# Patient Record
Sex: Male | Born: 1960 | Race: White | Hispanic: No | Marital: Single | Smoking: Current every day smoker
Health system: Southern US, Community
[De-identification: ages and names within clinical notes are randomized; demographics above are authoritative.]

## PROBLEM LIST (undated history)

## (undated) DIAGNOSIS — M1990 Unspecified osteoarthritis, unspecified site: Secondary | ICD-10-CM

## (undated) DIAGNOSIS — F32A Depression, unspecified: Secondary | ICD-10-CM

## (undated) DIAGNOSIS — I2510 Atherosclerotic heart disease of native coronary artery without angina pectoris: Secondary | ICD-10-CM

## (undated) DIAGNOSIS — Z955 Presence of coronary angioplasty implant and graft: Secondary | ICD-10-CM

## (undated) DIAGNOSIS — F329 Major depressive disorder, single episode, unspecified: Secondary | ICD-10-CM

## (undated) DIAGNOSIS — F17210 Nicotine dependence, cigarettes, uncomplicated: Secondary | ICD-10-CM

## (undated) DIAGNOSIS — E785 Hyperlipidemia, unspecified: Secondary | ICD-10-CM

## (undated) DIAGNOSIS — I219 Acute myocardial infarction, unspecified: Secondary | ICD-10-CM

## (undated) HISTORY — DX: Nicotine dependence, cigarettes, uncomplicated: F17.210

## (undated) HISTORY — DX: Presence of coronary angioplasty implant and graft: Z95.5

## (undated) HISTORY — DX: Unspecified osteoarthritis, unspecified site: M19.90

## (undated) HISTORY — DX: Atherosclerotic heart disease of native coronary artery without angina pectoris: I25.10

## (undated) HISTORY — DX: Hyperlipidemia, unspecified: E78.5

---

## 2017-12-15 ENCOUNTER — Encounter (HOSPITAL_COMMUNITY): Payer: Self-pay | Admitting: Emergency Medicine

## 2017-12-15 ENCOUNTER — Emergency Department (HOSPITAL_COMMUNITY): Payer: BLUE CROSS/BLUE SHIELD

## 2017-12-15 ENCOUNTER — Other Ambulatory Visit: Payer: Self-pay

## 2017-12-15 ENCOUNTER — Observation Stay (HOSPITAL_COMMUNITY)
Admission: EM | Admit: 2017-12-15 | Discharge: 2017-12-17 | Disposition: A | Discharge status: Home / Self Care | Payer: BLUE CROSS/BLUE SHIELD | Attending: Internal Medicine | Admitting: Internal Medicine

## 2017-12-15 DIAGNOSIS — Z7982 Long term (current) use of aspirin: Secondary | ICD-10-CM | POA: Insufficient documentation

## 2017-12-15 DIAGNOSIS — E785 Hyperlipidemia, unspecified: Secondary | ICD-10-CM | POA: Diagnosis not present

## 2017-12-15 DIAGNOSIS — I252 Old myocardial infarction: Secondary | ICD-10-CM | POA: Diagnosis not present

## 2017-12-15 DIAGNOSIS — I255 Ischemic cardiomyopathy: Secondary | ICD-10-CM | POA: Insufficient documentation

## 2017-12-15 DIAGNOSIS — T50906A Underdosing of unspecified drugs, medicaments and biological substances, initial encounter: Secondary | ICD-10-CM | POA: Diagnosis not present

## 2017-12-15 DIAGNOSIS — E669 Obesity, unspecified: Secondary | ICD-10-CM | POA: Insufficient documentation

## 2017-12-15 DIAGNOSIS — Z955 Presence of coronary angioplasty implant and graft: Secondary | ICD-10-CM | POA: Insufficient documentation

## 2017-12-15 DIAGNOSIS — Z9114 Patient's other noncompliance with medication regimen: Secondary | ICD-10-CM | POA: Diagnosis not present

## 2017-12-15 DIAGNOSIS — I25110 Atherosclerotic heart disease of native coronary artery with unstable angina pectoris: Secondary | ICD-10-CM | POA: Diagnosis not present

## 2017-12-15 DIAGNOSIS — F17210 Nicotine dependence, cigarettes, uncomplicated: Secondary | ICD-10-CM | POA: Insufficient documentation

## 2017-12-15 DIAGNOSIS — R7303 Prediabetes: Secondary | ICD-10-CM | POA: Insufficient documentation

## 2017-12-15 DIAGNOSIS — I214 Non-ST elevation (NSTEMI) myocardial infarction: Secondary | ICD-10-CM

## 2017-12-15 DIAGNOSIS — Z6830 Body mass index (BMI) 30.0-30.9, adult: Secondary | ICD-10-CM | POA: Diagnosis not present

## 2017-12-15 DIAGNOSIS — I209 Angina pectoris, unspecified: Secondary | ICD-10-CM | POA: Diagnosis present

## 2017-12-15 DIAGNOSIS — R079 Chest pain, unspecified: Secondary | ICD-10-CM | POA: Diagnosis not present

## 2017-12-15 DIAGNOSIS — Z91128 Patient's intentional underdosing of medication regimen for other reason: Secondary | ICD-10-CM | POA: Diagnosis not present

## 2017-12-15 DIAGNOSIS — F329 Major depressive disorder, single episode, unspecified: Secondary | ICD-10-CM | POA: Diagnosis not present

## 2017-12-15 DIAGNOSIS — Z79899 Other long term (current) drug therapy: Secondary | ICD-10-CM | POA: Diagnosis not present

## 2017-12-15 DIAGNOSIS — N289 Disorder of kidney and ureter, unspecified: Secondary | ICD-10-CM | POA: Diagnosis not present

## 2017-12-15 DIAGNOSIS — I208 Other forms of angina pectoris: Secondary | ICD-10-CM

## 2017-12-15 DIAGNOSIS — I5022 Chronic systolic (congestive) heart failure: Secondary | ICD-10-CM | POA: Diagnosis not present

## 2017-12-15 DIAGNOSIS — I2510 Atherosclerotic heart disease of native coronary artery without angina pectoris: Secondary | ICD-10-CM | POA: Diagnosis not present

## 2017-12-15 DIAGNOSIS — I25119 Atherosclerotic heart disease of native coronary artery with unspecified angina pectoris: Secondary | ICD-10-CM | POA: Diagnosis not present

## 2017-12-15 HISTORY — DX: Depression, unspecified: F32.A

## 2017-12-15 HISTORY — DX: Acute myocardial infarction, unspecified: I21.9

## 2017-12-15 HISTORY — DX: Major depressive disorder, single episode, unspecified: F32.9

## 2017-12-15 LAB — CBC
HEMATOCRIT: 42.1 % (ref 39.0–52.0)
Hemoglobin: 13.5 g/dL (ref 13.0–17.0)
MCH: 30.9 pg (ref 26.0–34.0)
MCHC: 32.1 g/dL (ref 30.0–36.0)
MCV: 96.3 fL (ref 78.0–100.0)
Platelets: 243 10*3/uL (ref 150–400)
RBC: 4.37 MIL/uL (ref 4.22–5.81)
RDW: 12.1 % (ref 11.5–15.5)
WBC: 10.9 10*3/uL — AB (ref 4.0–10.5)

## 2017-12-15 LAB — I-STAT TROPONIN, ED
TROPONIN I, POC: 0.01 ng/mL (ref 0.00–0.08)
Troponin i, poc: 0 ng/mL (ref 0.00–0.08)

## 2017-12-15 LAB — BASIC METABOLIC PANEL
ANION GAP: 10 (ref 5–15)
BUN: 13 mg/dL (ref 6–20)
CHLORIDE: 108 mmol/L (ref 98–111)
CO2: 22 mmol/L (ref 22–32)
Calcium: 9.3 mg/dL (ref 8.9–10.3)
Creatinine, Ser: 1.36 mg/dL — ABNORMAL HIGH (ref 0.61–1.24)
GFR calc Af Amer: >60 mL/min (ref >60)
GFR, EST NON AFRICAN AMERICAN: 58 mL/min — AB (ref >60)
Glucose, Bld: 103 mg/dL — ABNORMAL HIGH (ref 70–99)
POTASSIUM: 3.7 mmol/L (ref 3.5–5.1)
SODIUM: 140 mmol/L (ref 135–145)

## 2017-12-15 MED ORDER — MORPHINE SULFATE (PF) 4 MG/ML IV SOLN: 2.0000 mg | INTRAVENOUS | Status: DC | PRN

## 2017-12-15 MED ORDER — ACETAMINOPHEN 325 MG PO TABS
650.0000 mg | ORAL_TABLET | ORAL | Status: DC | PRN
  Administered 2017-12-16: 650 mg via ORAL
  Filled 2017-12-15: qty 2

## 2017-12-15 MED ORDER — ASPIRIN 81 MG PO CHEW
324.0000 mg | CHEWABLE_TABLET | Freq: Once | ORAL | Status: AC
  Administered 2017-12-15: 324 mg via ORAL
  Filled 2017-12-15: qty 4

## 2017-12-15 MED ORDER — NITROGLYCERIN 0.4 MG SL SUBL
0.4000 mg | SUBLINGUAL_TABLET | SUBLINGUAL | Status: DC | PRN
Start: 2017-12-15 — End: 2017-12-17

## 2017-12-15 MED ORDER — ASPIRIN EC 81 MG PO TBEC
81.0000 mg | DELAYED_RELEASE_TABLET | Freq: Every day | ORAL | Status: DC
  Administered 2017-12-16 – 2017-12-17 (×2): 81 mg via ORAL
  Filled 2017-12-15 (×2): qty 1

## 2017-12-15 MED ORDER — NICOTINE POLACRILEX 2 MG MT GUM: 2.0000 mg | CHEWING_GUM | OROMUCOSAL | Status: DC | PRN

## 2017-12-15 MED ORDER — NICOTINE POLACRILEX 2 MG MT GUM
4.0000 mg | CHEWING_GUM | OROMUCOSAL | Status: DC
  Administered 2017-12-15 – 2017-12-17 (×7): 4 mg via ORAL
  Filled 2017-12-15 (×9): qty 2

## 2017-12-15 MED ORDER — ONDANSETRON HCL 4 MG/2ML IJ SOLN: 4.0000 mg | Freq: Four times a day (QID) | INTRAMUSCULAR | Status: DC | PRN

## 2017-12-15 MED ORDER — SODIUM CHLORIDE 0.9 % IV SOLN
INTRAVENOUS | Status: DC
  Administered 2017-12-15: 22:00:00 via INTRAVENOUS

## 2017-12-15 MED ORDER — ATORVASTATIN CALCIUM 40 MG PO TABS
40.0000 mg | ORAL_TABLET | Freq: Every day | ORAL | Status: DC
  Administered 2017-12-16: 40 mg via ORAL
  Filled 2017-12-15 (×2): qty 1

## 2017-12-15 MED ORDER — ENOXAPARIN SODIUM 40 MG/0.4ML ~~LOC~~ SOLN
40.0000 mg | SUBCUTANEOUS | Status: DC
  Filled 2017-12-15: qty 0.4

## 2017-12-15 MED ORDER — METOPROLOL TARTRATE 12.5 MG HALF TABLET
12.5000 mg | ORAL_TABLET | Freq: Two times a day (BID) | ORAL | Status: DC
  Administered 2017-12-16 (×2): 12.5 mg via ORAL
  Filled 2017-12-15 (×3): qty 1

## 2017-12-15 NOTE — ED Notes (Signed)
Pt OK to eat per MD Plunkett. Pt given sandwich and a coke

## 2017-12-15 NOTE — ED Triage Notes (Signed)
Patient complains of jaw pain that started at 1030 this morning. States he took home rx of NTG and pain improved. Reports this pain feels identical to pain when he had an MI in 2013. Denies other complaints at this time. Patient alert, oriented, and in no apparent distress at this time.

## 2017-12-15 NOTE — ED Provider Notes (Signed)
[facility] EMERGENCY DEPARTMENT Provider Note   CSN: [id number] Arrival date & time: 12/15/17  1241     History   Chief Complaint Chief Complaint  Patient presents with  . Jaw Pain    HPI Elvie Maines is a 57 y.o. male.  Patient is a 57 year old male with a history of depression and prior MI status post stent placement in the LAD 6 years ago who is presenting today with chest discomfort with radiation to bilateral jaws.  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  Patient states the pain in total lasted about 2 hours.  He states he was a little bit sweaty but did not feel short of breath.  He had some nitroglycerin and his doctor had prescribed just in case and he took one.  5 minutes later he was still having the jaw pain but the chest pain and improved so he took another one.  Upon arrival here he states the pain had almost completely resolved.  He states he has not really had chest pain like this since he had the stent placed 6 years ago.  He is not currently taking any type of cholesterol medicine and has stopped taking aspirin regularly.  He continues to smoke but denies any drug use or heavy alcohol use.  He does not see a cardiologist regularly but does have a doctor in [place] he sees yearly.  He denies any abdominal pain today, nausea or vomiting.  He has not recently been ill and denies any cough or congestion.  Last stress test was about 2 years ago when he states he had some mild chest pain but it was told that it was not related to his heart.  The history is provided by the patient.    Past Medical History:  Diagnosis Date  . Depression   . MI (myocardial infarction) (HCC)     There are no active problems to display for this patient.         Home Medications    Prior to Admission medications   Not on File    Family  History No family history on file.  Social History Social History   Tobacco Use  . Smoking status: Not on file  Substance Use Topics  . Alcohol use: Not on file  . Drug use: Not on file     Allergies   Patient has no known allergies.   Review of Systems Review of Systems  All other systems reviewed and are negative.    Physical Exam Updated Vital Signs BP 137/80   Pulse 62   Temp 98.2 F (36.8 C) (Oral)   Resp 18   SpO2 97%   Physical Exam  Constitutional: He is oriented to person, place, and time. He appears well-developed and well-nourished. No distress.  HENT:  Head: Normocephalic and atraumatic.  Mouth/Throat: Oropharynx is clear and moist.  Eyes: Pupils are equal, round, and reactive to light. Conjunctivae and EOM are normal.  Neck: Normal range of motion. Neck supple.  Cardiovascular: Normal rate, regular rhythm and intact distal pulses.  No murmur heard. Pulmonary/Chest: Effort normal and breath sounds normal. No respiratory distress. He has no wheezes. He has no rales.  Abdominal: Soft. He exhibits no distension. There is no tenderness. There is no rebound and no guarding.  Musculoskeletal: Normal range of motion. He exhibits no edema  or tenderness.  Neurological: He is alert and oriented to person, place, and time.  Skin: Skin is warm and dry. No rash noted. No erythema.  Psychiatric: He has a normal mood and affect. His behavior is normal.  Nursing note and vitals reviewed.    ED Treatments / Results  Labs (all labs ordered are listed, but only abnormal results are displayed) Labs Reviewed  BASIC METABOLIC PANEL - Abnormal; Notable for the following components:      Result Value   Glucose, Bld 103 (*)    Creatinine, Ser 1.36 (*)    GFR calc non Af Amer 58 (*)    All other components within normal limits  CBC - Abnormal; Notable for the following components:   WBC 10.9 (*)    All other components within normal limits  I-STAT TROPONIN, ED    I-STAT TROPONIN, ED    EKG EKG Interpretation  Date/Time:  Monday December 15 2017 12:50:59 EDT Ventricular Rate:  79 PR Interval:  124 QRS Duration: 80 QT Interval:  372 QTC Calculation: 426 R Axis:   81 Text Interpretation:  Normal sinus rhythm Normal ECG No previous tracing Confirmed by Gwyneth SproutPlunkett, Dayna Alia ([id number]) on 12/15/2017 7:15:56 PM   Radiology Dg Chest 2 View  Result Date: 12/15/2017 CLINICAL DATA:  Chest pain. EXAM: CHEST - 2 VIEW COMPARISON:  None. FINDINGS: The cardiomediastinal silhouette is within normal limits. The lungs are well inflated and clear. There is no evidence of pleural effusion or pneumothorax. No acute osseous abnormality is identified. IMPRESSION: No active cardiopulmonary disease. Electronically Signed   By: Sebastian AcheAllen  Grady M.D.   On: 12/15/2017 13:40    Procedures Procedures (including critical care time)  Medications Ordered in ED Medications  nicotine polacrilex (NICORETTE) gum 4 mg (has no administration in time range)     Initial Impression / Assessment and Plan / ED Course  I have reviewed the triage vital signs and the nursing notes.  Pertinent labs & imaging results that were available during my care of the patient were reviewed by me and considered in my medical decision making (see chart for details).    57 year old male with a history of MI presenting today with chest pain with concern for ACS.  Patient was hanging a light and developed pain pressure-like in his chest that radiated to bilateral jaws with some diaphoresis.  He denied any shortness of breath but symptoms resolved after taking nitroglycerin.  Upon arrival here patient was pain-free.  Patient's EKG is within normal limits here.  Delta troponin and labs are within normal limits however given patient's story and Heart score of 4 feel he would benefit from admission and chest pain rule out.  Final Clinical Impressions(s) / ED Diagnoses   Final diagnoses:  Exertional angina [facility](HCC)     ED Discharge Orders    None       Gwyneth SproutPlunkett, Zaara Sprowl, MD 12/15/17 [phone]

## 2017-12-15 NOTE — ED Notes (Signed)
ED Provider at bedside. 

## 2017-12-15 NOTE — H&P (Signed)
History and Physical    Adam LarocheJeffrey Kneip ZOX:[id number] DOB: 1961-04-02 DOA: 12/15/2017  PCP: Charlane FerrettiSkakle, [place] DO    Patient coming from: Home   Chief Complaint: Chest and jaw pain  HPI: Adam LarocheJeffrey Kerr is a 57 y.o. male with medical history significant for coronary artery disease with stent, no longer taking his medications, and now presented to the emergency department for evaluation of discomfort in his chest with radiation to the bilateral jaw. 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 There was no significant shortness of breath or nausea, but he reports some sweating. He describes symptoms as similar to those that led to the stent placement several years ago. He took a nitroglycerin with improvement, then experience resolution and the symptoms are a second nitroglycerin. Denies any lower extremity swelling or tenderness and denies recent fevers, chills, cough, or shortness of breath.  ED Course: Upon arrival to the ED, patient is found to be afebrile, saturating well on room air, and with vitals otherwise normal. EKG features normal sinus rhythm and chest x-ray is negative for acute cardiopulmonary disease. Chemistry panel reveals a creatinine 1.36, up from 1.11 in May. CBC is notable for mild leukocytosis to 10,900. Troponin is normal 2 in the ED. Patient remains clinically stable, in no apparent respiratory distress, and chest pain free. He will be observed for ongoing evaluation and management.  Review of Systems:  All other systems reviewed and apart from HPI, are negative.  Past Medical History:  Diagnosis Date  . Depression   . MI (myocardial infarction) (HCC)     History reviewed. No pertinent surgical history.   has no tobacco, alcohol, and drug history on file.  No Known Allergies  History reviewed. No pertinent family  history.   Prior to Admission medications   Not on File    Physical Exam: Vitals:   12/15/17 1247 12/15/17 1640 12/15/17 1847  BP: 113/74 125/78 137/80  Pulse: 76 66 62  Resp: 20 20 18   Temp: 98.2 F (36.8 C)  98.2 F (36.8 C)  TempSrc: Oral  Oral  SpO2: 100% 96% 97%      Constitutional: NAD, calm  Eyes: PERTLA, lids and conjunctivae normal ENMT: Mucous membranes are moist. Posterior pharynx clear of any exudate or lesions.   Neck: normal, supple, no masses, no thyromegaly Respiratory: clear to auscultation bilaterally, no wheezing, no crackles. Normal respiratory effort.    Cardiovascular: S1 & S2 heard, regular rate and rhythm. No extremity edema.   Abdomen: No distension, no tenderness, soft. Bowel sounds normal.  Musculoskeletal: no clubbing / cyanosis. No joint deformity upper and lower extremities.    Skin: no significant rashes, lesions, ulcers. Warm, dry, well-perfused. Neurologic: CN 2-12 grossly intact. Sensation intact. Strength 5/5 in all 4 limbs.  Psychiatric: Alert and oriented x 3. Calm, cooperative.     Labs on Admission: I have personally reviewed following labs and imaging studies  CBC: Recent Labs  Lab 12/15/17 1318  WBC 10.9*  HGB 13.5  HCT 42.1  MCV 96.3  PLT 243   Basic Metabolic Panel: Recent Labs  Lab 12/15/17 1318  NA 140  K 3.7  CL 108  CO2 22  GLUCOSE 103*  BUN 13  CREATININE 1.36*  CALCIUM 9.3   GFR: CrCl cannot be calculated (Unknown ideal weight.). Liver Function Tests: No results for input(s): AST,  ALT, ALKPHOS, BILITOT, PROT, ALBUMIN in the last 168 hours. No results for input(s): LIPASE, AMYLASE in the last 168 hours. No results for input(s): AMMONIA in the last 168 hours. Coagulation Profile: No results for input(s): INR, PROTIME in the last 168 hours. Cardiac Enzymes: No results for input(s): CKTOTAL, CKMB, CKMBINDEX, TROPONINI in the last 168 hours. BNP (last 3 results) No results for input(s): PROBNP in the  last 8760 hours. HbA1C: No results for input(s): HGBA1C in the last 72 hours. CBG: No results for input(s): GLUCAP in the last 168 hours. Lipid Profile: No results for input(s): CHOL, HDL, LDLCALC, TRIG, CHOLHDL, LDLDIRECT in the last 72 hours. Thyroid Function Tests: No results for input(s): TSH, T4TOTAL, FREET4, T3FREE, THYROIDAB in the last 72 hours. Anemia Panel: No results for input(s): VITAMINB12, FOLATE, FERRITIN, TIBC, IRON, RETICCTPCT in the last 72 hours. Urine analysis: No results found for: COLORURINE, APPEARANCEUR, LABSPEC, PHURINE, GLUCOSEU, HGBUR, BILIRUBINUR, KETONESUR, PROTEINUR, UROBILINOGEN, NITRITE, LEUKOCYTESUR Sepsis Labs: @LABRCNTIP (procalcitonin:4,lacticidven:4) )No results found for this or any previous visit (from the past 240 hour(s)).   Radiological Exams on Admission: Dg Chest 2 View  Result Date: 12/15/2017 CLINICAL DATA:  Chest pain. EXAM: CHEST - 2 VIEW COMPARISON:  None. FINDINGS: The cardiomediastinal silhouette is within normal limits. The lungs are well inflated and clear. There is no evidence of pleural effusion or pneumothorax. No acute osseous abnormality is identified. IMPRESSION: No active cardiopulmonary disease. Electronically Signed   By: Sebastian Ache M.D.   On: 12/15/2017 13:40    EKG: Independently reviewed. Normal sinus rhythm.   Assessment/Plan   1. Chest pain, CAD  - Patient reports hx of CAD with stent several years ago, now presenting with chest discomfort radiating to jaws that resolved after a second NTG prior to arrival  - No ischemic features noted on EKG, CXR unremarkable, and troponin negative x2 in ED  - He is prescribed ASA, beta-blocker, and statin, but has not been taking  - NM stress test from 02/2016 with "no substantial fixed or inducible ischemia. LV EF 63%"  - Continue cardiac monitoring, give ASA 324 mg now, obtain serial troponin measurements, resume statin, beta-blocker, and ASA    2. Mild renal insufficiency  -  SCr is 1.36 on admission, up from 1.11 in May  - Hydrate gently overnight with IVF, avoid nephrotoxins, repeat chem panel in am     DVT prophylaxis: Lovenox Code Status: Full Family Communication: Discussed with patient  Consults called: None Admission status: Observation    Briscoe Deutscher, MD [facility] Pager [phone]  If 7PM-7AM, please contact night-coverage [email or web address] Password [facility]  12/15/2017, 8:28 PM

## 2017-12-15 NOTE — ED Notes (Addendum)
Attempted report x1. 

## 2017-12-16 ENCOUNTER — Encounter (HOSPITAL_COMMUNITY)
Admission: EM | Disposition: A | Discharge status: Home / Self Care | Payer: Self-pay | Source: Home / Self Care | Attending: Emergency Medicine

## 2017-12-16 DIAGNOSIS — R079 Chest pain, unspecified: Secondary | ICD-10-CM

## 2017-12-16 DIAGNOSIS — I25119 Atherosclerotic heart disease of native coronary artery with unspecified angina pectoris: Secondary | ICD-10-CM | POA: Diagnosis not present

## 2017-12-16 DIAGNOSIS — I25110 Atherosclerotic heart disease of native coronary artery with unstable angina pectoris: Secondary | ICD-10-CM

## 2017-12-16 DIAGNOSIS — I2510 Atherosclerotic heart disease of native coronary artery without angina pectoris: Secondary | ICD-10-CM | POA: Diagnosis not present

## 2017-12-16 DIAGNOSIS — I208 Other forms of angina pectoris: Secondary | ICD-10-CM

## 2017-12-16 DIAGNOSIS — N289 Disorder of kidney and ureter, unspecified: Secondary | ICD-10-CM | POA: Diagnosis not present

## 2017-12-16 DIAGNOSIS — I252 Old myocardial infarction: Secondary | ICD-10-CM | POA: Diagnosis not present

## 2017-12-16 DIAGNOSIS — I214 Non-ST elevation (NSTEMI) myocardial infarction: Secondary | ICD-10-CM

## 2017-12-16 DIAGNOSIS — F329 Major depressive disorder, single episode, unspecified: Secondary | ICD-10-CM | POA: Diagnosis not present

## 2017-12-16 HISTORY — PX: INTRAVASCULAR PRESSURE WIRE/FFR STUDY: CATH118243

## 2017-12-16 HISTORY — PX: LEFT HEART CATH AND CORONARY ANGIOGRAPHY: CATH118249

## 2017-12-16 LAB — HEMOGLOBIN A1C
Hgb A1c MFr Bld: 6.1 % — ABNORMAL HIGH (ref 4.8–5.6)
Mean Plasma Glucose: 128.37 mg/dL

## 2017-12-16 LAB — LIPID PANEL
Cholesterol: 181 mg/dL (ref 0–200)
HDL: 27 mg/dL — ABNORMAL LOW (ref >40)
LDL CALC: 120 mg/dL — AB (ref 0–99)
Total CHOL/HDL Ratio: 6.7 RATIO
Triglycerides: 172 mg/dL — ABNORMAL HIGH (ref <150)
VLDL: 34 mg/dL (ref 0–40)

## 2017-12-16 LAB — TROPONIN I
TROPONIN I: 0.06 ng/mL — AB (ref <0.03)
Troponin I: 0.1 ng/mL (ref <0.03)
Troponin I: 0.08 ng/mL (ref <0.03)

## 2017-12-16 LAB — CBC
HCT: 40.4 % (ref 39.0–52.0)
Hemoglobin: 12.8 g/dL — ABNORMAL LOW (ref 13.0–17.0)
MCH: 30.5 pg (ref 26.0–34.0)
MCHC: 31.7 g/dL (ref 30.0–36.0)
MCV: 96.2 fL (ref 78.0–100.0)
PLATELETS: 220 10*3/uL (ref 150–400)
RBC: 4.2 MIL/uL — ABNORMAL LOW (ref 4.22–5.81)
RDW: 12.3 % (ref 11.5–15.5)
WBC: 9.4 10*3/uL (ref 4.0–10.5)

## 2017-12-16 LAB — BASIC METABOLIC PANEL
Anion gap: 9 (ref 5–15)
BUN: 11 mg/dL (ref 6–20)
CALCIUM: 8.7 mg/dL — AB (ref 8.9–10.3)
CO2: 24 mmol/L (ref 22–32)
Chloride: 111 mmol/L (ref 98–111)
Creatinine, Ser: 1.19 mg/dL (ref 0.61–1.24)
GFR calc Af Amer: >60 mL/min (ref >60)
GLUCOSE: 94 mg/dL (ref 70–99)
Potassium: 4.2 mmol/L (ref 3.5–5.1)
Sodium: 144 mmol/L (ref 135–145)

## 2017-12-16 LAB — POCT ACTIVATED CLOTTING TIME: Activated Clotting Time: 334 seconds

## 2017-12-16 LAB — HIV ANTIBODY (ROUTINE TESTING W REFLEX): HIV Screen 4th Generation wRfx: NONREACTIVE

## 2017-12-16 LAB — HEPARIN LEVEL (UNFRACTIONATED): Heparin Unfractionated: 0.42 IU/mL (ref 0.30–0.70)

## 2017-12-16 SURGERY — LEFT HEART CATH AND CORONARY ANGIOGRAPHY
Anesthesia: LOCAL

## 2017-12-16 MED ORDER — SODIUM CHLORIDE 0.9 % WEIGHT BASED INFUSION: 1.0000 mL/kg/h | INTRAVENOUS | Status: DC

## 2017-12-16 MED ORDER — HEPARIN SODIUM (PORCINE) 1000 UNIT/ML IJ SOLN
INTRAMUSCULAR | Status: AC
  Filled 2017-12-16: qty 1

## 2017-12-16 MED ORDER — HEPARIN SODIUM (PORCINE) 1000 UNIT/ML IJ SOLN
INTRAMUSCULAR | Status: DC | PRN
  Administered 2017-12-16: 5000 [IU] via INTRAVENOUS
  Administered 2017-12-16: 4000 [IU] via INTRAVENOUS

## 2017-12-16 MED ORDER — SODIUM CHLORIDE 0.9 % IV SOLN: 250.0000 mL | INTRAVENOUS | Status: DC | PRN

## 2017-12-16 MED ORDER — NITROGLYCERIN 1 MG/10 ML FOR IR/CATH LAB
INTRA_ARTERIAL | Status: AC
  Filled 2017-12-16: qty 10

## 2017-12-16 MED ORDER — MIDAZOLAM HCL 2 MG/2ML IJ SOLN
INTRAMUSCULAR | Status: DC | PRN
  Administered 2017-12-16: 1 mg via INTRAVENOUS

## 2017-12-16 MED ORDER — LIDOCAINE HCL (PF) 1 % IJ SOLN
INTRAMUSCULAR | Status: DC | PRN
  Administered 2017-12-16: 2 mL

## 2017-12-16 MED ORDER — SODIUM CHLORIDE 0.9% FLUSH: 3.0000 mL | INTRAVENOUS | Status: DC | PRN

## 2017-12-16 MED ORDER — SODIUM CHLORIDE 0.9 % WEIGHT BASED INFUSION
1.0000 mL/kg/h | INTRAVENOUS | Status: DC
  Administered 2017-12-16: 1 mL/kg/h via INTRAVENOUS

## 2017-12-16 MED ORDER — MIDAZOLAM HCL 2 MG/2ML IJ SOLN
INTRAMUSCULAR | Status: AC
  Filled 2017-12-16: qty 2

## 2017-12-16 MED ORDER — HEPARIN (PORCINE) IN NACL 2-0.9 UNITS/ML
INTRAMUSCULAR | Status: DC | PRN
  Administered 2017-12-16: 10 mL via INTRA_ARTERIAL

## 2017-12-16 MED ORDER — VERAPAMIL HCL 2.5 MG/ML IV SOLN
INTRAVENOUS | Status: AC
  Filled 2017-12-16: qty 2

## 2017-12-16 MED ORDER — SODIUM CHLORIDE 0.9 % WEIGHT BASED INFUSION
3.0000 mL/kg/h | INTRAVENOUS | Status: DC
  Administered 2017-12-16: 3 mL/kg/h via INTRAVENOUS

## 2017-12-16 MED ORDER — HEPARIN (PORCINE) IN NACL 100-0.45 UNIT/ML-% IJ SOLN
1200.0000 [IU]/h | INTRAMUSCULAR | Status: DC
  Administered 2017-12-16: 1200 [IU]/h via INTRAVENOUS
  Filled 2017-12-16: qty 250

## 2017-12-16 MED ORDER — SODIUM CHLORIDE 0.9% FLUSH: 3.0000 mL | Freq: Two times a day (BID) | INTRAVENOUS | Status: DC

## 2017-12-16 MED ORDER — HEPARIN (PORCINE) IN NACL 1000-0.9 UT/500ML-% IV SOLN
INTRAVENOUS | Status: DC | PRN
  Administered 2017-12-16 (×2): 500 mL

## 2017-12-16 MED ORDER — DULOXETINE HCL 60 MG PO CPEP
60.0000 mg | ORAL_CAPSULE | Freq: Two times a day (BID) | ORAL | Status: DC
  Administered 2017-12-16 – 2017-12-17 (×3): 60 mg via ORAL
  Filled 2017-12-16 (×3): qty 1

## 2017-12-16 MED ORDER — FENTANYL CITRATE (PF) 100 MCG/2ML IJ SOLN
INTRAMUSCULAR | Status: AC
  Filled 2017-12-16: qty 2

## 2017-12-16 MED ORDER — HEPARIN (PORCINE) IN NACL 1000-0.9 UT/500ML-% IV SOLN
INTRAVENOUS | Status: AC
  Filled 2017-12-16: qty 1000

## 2017-12-16 MED ORDER — FENTANYL CITRATE (PF) 100 MCG/2ML IJ SOLN
INTRAMUSCULAR | Status: DC | PRN
  Administered 2017-12-16: 25 ug via INTRAVENOUS

## 2017-12-16 MED ORDER — HEPARIN BOLUS VIA INFUSION
4000.0000 [IU] | Freq: Once | INTRAVENOUS | Status: AC
  Administered 2017-12-16: 4000 [IU] via INTRAVENOUS
  Filled 2017-12-16: qty 4000

## 2017-12-16 MED ORDER — NITROGLYCERIN 1 MG/10 ML FOR IR/CATH LAB
INTRA_ARTERIAL | Status: DC | PRN
  Administered 2017-12-16: 200 ug via INTRACORONARY

## 2017-12-16 MED ORDER — LIDOCAINE HCL (PF) 1 % IJ SOLN
INTRAMUSCULAR | Status: AC
  Filled 2017-12-16: qty 30

## 2017-12-16 MED ORDER — IOHEXOL 350 MG/ML SOLN
INTRAVENOUS | Status: DC | PRN
  Administered 2017-12-16: 90 mL via INTRA_ARTERIAL

## 2017-12-16 MED ORDER — SODIUM CHLORIDE 0.9% FLUSH
3.0000 mL | Freq: Two times a day (BID) | INTRAVENOUS | Status: DC
  Administered 2017-12-16: 3 mL via INTRAVENOUS

## 2017-12-16 MED ORDER — ADENOSINE 12 MG/4ML IV SOLN
INTRAVENOUS | Status: AC
  Filled 2017-12-16: qty 16

## 2017-12-16 MED ORDER — ADENOSINE (DIAGNOSTIC) 140MCG/KG/MIN
INTRAVENOUS | Status: DC | PRN
  Administered 2017-12-16: 140 ug/kg/min via INTRAVENOUS

## 2017-12-16 SURGICAL SUPPLY — 13 items
CATH 5FR JL3.5 JR4 ANG PIG MP (CATHETERS) ×2 IMPLANT
CATH LAUNCHER 6FR EBU3.5 (CATHETERS) ×2 IMPLANT
DEVICE RAD COMP TR BAND LRG (VASCULAR PRODUCTS) ×2 IMPLANT
GLIDESHEATH SLEND SS 6F .021 (SHEATH) ×2 IMPLANT
GUIDEWIRE INQWIRE 1.5J.035X260 (WIRE) ×1 IMPLANT
GUIDEWIRE PRESSURE COMET II (WIRE) ×2 IMPLANT
INQWIRE 1.5J .035X260CM (WIRE) ×2
KIT ESSENTIALS PG (KITS) ×2 IMPLANT
KIT HEART LEFT (KITS) ×2 IMPLANT
PACK CARDIAC CATHETERIZATION (CUSTOM PROCEDURE TRAY) ×2 IMPLANT
SYR MEDRAD MARK V 150ML (SYRINGE) ×2 IMPLANT
TRANSDUCER W/STOPCOCK (MISCELLANEOUS) ×2 IMPLANT
TUBING CIL FLEX 10 FLL-RA (TUBING) ×2 IMPLANT

## 2017-12-16 NOTE — Consult Note (Signed)
Cardiology Consultation:   Patient ID: Adam Kerr; [id number]; 12-Mar-1961   Admit date: 12/15/2017 Date of Consult: 12/16/2017  Primary Care Provider: Patient, No Pcp Per Primary Cardiologist: [facility]; Dr. Rennis Golden Primary Electrophysiologist:  None   Patient Profile:   Adam Kerr is a 57 y.o. male with a PMH of CAD s/p MI with stent to LAD in 2013, ischemic cardiomyopathy with EF 40% at time of LHC 2013, dyslipidemia, prediabetes, depression, and tobacco abuse, who is being seen today for the evaluation of chest pain at the request of Dr. Benjamine Mola.  History of Present Illness:   Adam Kerr was in his usual state of health until yesterday morning when he began experiencing bilateral jaw pain, followed by central chest pain while at work. He reported some associated mild sweating but denied SOB, N/V, dizziness, lightheadedness, or syncope. He states his symptoms were reminiscent of what he experienced prior to having his stent placed several years ago. He took 2 SL nitro with improvement in symptoms, ultimately resolved after 2 hours without additional intervention.    He does not follow regularly with a cardiologist and reports not being on cardiac medications with the exception of a baby aspirin daily. His last ischemic evaluation was a NST 2017 which did not reveal any substantial fixed or inducible ischemia; EF estimated to be 63%. He underwent an LHC in 2013 for the evaluation of an MI and was found to have an 80%-90% lesion in the proximal LAD with thrombus present; managed with PCI. He was noted to have an EF of 40% during the catheterization and was started on metoprolol succinate prior to discharge. He reports intermittent compliance with his medications. He also has ongoing tobacco abuse, smoking 10 cigarettes a day as well as chewing nicorette gum.   At the time of this evaluation he is chest pain free. He reports increased fatigue over the past 2 weeks. He denies recent  exertional chest pain or SOB. His girlfriend at bedside reports an episode of chest pain one year ago associated with diaphoresis and lightheadedness for which he took 3 SL nitro with improvement in symptoms but did not seek medical attention. She also reports snoring and frequent PND. Patient denies syncope, LE edema, or orthopnea.   Hospital course: VSS. Labs notable for electrolytes wnl, Cr 1.36>1.19, Hgb 13.5, PLT 243, Trop 0.00>0.01>0.06>0.10. CXR without acute findings. EKG with NSR no STE/D, no TWI. He was given ASA 324mg  in the ED and IVFs. Admitted to medicine with cardiology following for further recommendations.   Past Medical History:  Diagnosis Date  . Depression   . MI (myocardial infarction) (HCC)     History reviewed. No pertinent surgical history.   Home Medications:  Prior to Admission medications   Medication Sig Start Date End Date Taking? Authorizing Provider  aspirin EC 81 MG tablet Take 81 mg by mouth daily. 10/14/17  Yes [provider]  DULoxetine (CYMBALTA) 60 MG capsule Take 60 mg by mouth 2 (two) times daily. 09/09/17  Yes [provider]  nitroGLYCERIN (NITROSTAT) 0.4 MG SL tablet Place 0.4 mg under the tongue every 5 (five) minutes as needed for chest pain. 10/14/17 10/14/18 Yes [provider]    Inpatient Medications: Scheduled Meds: . aspirin EC  81 mg Oral Daily  . atorvastatin  40 mg Oral q1800  . enoxaparin (LOVENOX) injection  40 mg Subcutaneous Q24H  . metoprolol tartrate  12.5 mg Oral BID  . nicotine polacrilex  4 mg Oral  Q4H while awake   Continuous Infusions: . sodium chloride 100 mL/hr at 12/16/17 0417   PRN Meds: acetaminophen, morphine injection, nitroGLYCERIN, ondansetron (ZOFRAN) IV  Allergies:   No Known Allergies  Social History:   Social History   Socioeconomic History  . Marital status: Single    Spouse name: Not on file  . Number of children: Not on file  . Years of education: Not on file  . Highest  education level: Not on file  Occupational History  . Not on file  Social Needs  . Financial resource strain: Not on file  . Food insecurity:    Worry: Not on file    Inability: Not on file  . Transportation needs:    Medical: Not on file    Non-medical: Not on file  Tobacco Use  . Smoking status: Current Every Day Smoker    Packs/day: 1.00    Years: 38.00    Pack years: 38.00    Types: Cigarettes  . Smokeless tobacco: Never Used  . Tobacco comment: Pt chews 10 pieces of Nicotine gum  Substance and Sexual Activity  . Alcohol use: Not on file  . Drug use: Not on file  . Sexual activity: Not on file  Lifestyle  . Physical activity:    Days per week: Not on file    Minutes per session: Not on file  . Stress: Not on file  Relationships  . Social connections:    Talks on phone: Not on file    Gets together: Not on file    Attends religious service: Not on file    Active member of club or organization: Not on file    Attends meetings of clubs or organizations: Not on file    Relationship status: Not on file  . Intimate partner violence:    Fear of current or ex partner: Not on file    Emotionally abused: Not on file    Physically abused: Not on file    Forced sexual activity: Not on file  Other Topics Concern  . Not on file  Social History Narrative  . Not on file    Family History:   No family history of premature CAD   ROS:  Please see the history of present illness.    All other ROS reviewed and negative.     Physical Exam/Data:   Vitals:   12/15/17 2045 12/15/17 2100 12/15/17 2132 12/16/17 0416  BP: 126/66 127/80 128/76 124/76  Pulse: 62 (!) 58 (!) 57 (!) 57  Resp: 16 17 18 18   Temp:   98 F (36.7 C) 97.9 F (36.6 C)  TempSrc:    Oral  SpO2: 94% 95% 97% 97%  Weight:   206 lb 14.4 oz (93.8 kg) 206 lb 14.1 oz (93.8 kg)  Height:   5\' 9"  (1.753 m)     Intake/Output Summary (Last 24 hours) at 12/16/2017 0749 Last data filed at 12/16/2017 0417 Gross per  24 hour  Intake 670.14 ml  Output 650 ml  Net 20.14 ml   Filed Weights   12/15/17 2132 12/16/17 0416  Weight: 206 lb 14.4 oz (93.8 kg) 206 lb 14.1 oz (93.8 kg)   Body mass index is 30.55 kg/m.  General:  Well nourished, well developed, laying in bed in no acute distress HEENT: sclera anicteric  Neck: no JVD Vascular: No carotid bruits; distal pulses 2+ bilaterally Cardiac:  normal S1, S2; RRR; no murmurs, gallops, or rubs Lungs:  clear to auscultation bilaterally,  no wheezing, rhonchi or rales  Abd: NABS, soft, nontender, no hepatomegaly Ext: no edema Musculoskeletal:  No deformities, BUE and BLE strength normal and equal Skin: warm and dry  Neuro:  CNs 2-12 intact, no focal abnormalities noted Psych:  Mood is depressed   EKG:  The EKG was personally reviewed and demonstrates:  NSR no STE/D, no TWI Telemetry:  Telemetry was personally reviewed and demonstrates:  NSR  Relevant CV Studies: Left heart catheterization 2013: CONCLUSIONS: 1.Normal systemic hemodynamics. 2.An 80% to 90% lesion in the proximal left anterior descending with thrombus present. 3.Left ventriculography shows anteroapical hypokinesis with ejection fraction of about 40%.  NST 2017: CONCLUSION:  No substantial fixed or inducible ischemia. Left ventricular ejection fraction of 63%.  Laboratory Data:  Chemistry Recent Labs  Lab 12/15/17 1318 12/16/17 0450  NA 140 144  K 3.7 4.2  CL 108 111  CO2 22 24  GLUCOSE 103* 94  BUN 13 11  CREATININE 1.36* 1.19  CALCIUM 9.3 8.7*  GFRNONAA 58* >60  GFRAA >60 >60  ANIONGAP 10 9    No results for input(s): PROT, ALBUMIN, AST, ALT, ALKPHOS, BILITOT in the last 168 hours. Hematology Recent Labs  Lab 12/15/17 1318  WBC 10.9*  RBC 4.37  HGB 13.5  HCT 42.1  MCV 96.3  MCH 30.9  MCHC 32.1  RDW 12.1  PLT 243   Cardiac Enzymes Recent Labs  Lab 12/15/17 2305 12/16/17 0450  TROPONINI 0.06* 0.10*    Recent Labs  Lab 12/15/17 1324  12/15/17 1935  TROPIPOC 0.00 0.01    BNPNo results for input(s): BNP, PROBNP in the last 168 hours.  DDimer No results for input(s): DDIMER in the last 168 hours.  Radiology/Studies:  Dg Chest 2 View  Result Date: 12/15/2017 CLINICAL DATA:  Chest pain. EXAM: CHEST - 2 VIEW COMPARISON:  None. FINDINGS: The cardiomediastinal silhouette is within normal limits. The lungs are well inflated and clear. There is no evidence of pleural effusion or pneumothorax. No acute osseous abnormality is identified. IMPRESSION: No active cardiopulmonary disease. Electronically Signed   By: Sebastian Ache M.D.   On: 12/15/2017 13:40    Assessment and Plan:   1. Chest pain in patient with CAD s/p MI with PCI to LAD in 2013: p/w sudden onset chest pain yesterday morning which felt similar to symptoms experienced with MI. Here EKG is non-ischemic, trop uptrending to 0.1 this morning. Symptoms are concerning for unstable angina.  - Will plan for LHC to further evaluate chest pain  - Will start a heparin gtt given troponin elevations - Continue ASA, statin, and metoprolol - Will check an echocardiogram  - Check a lipid panel and Hgb A1C for risk stratification   2. History of ischemic cardiomyopathy/systolic CHF: EF noted to be 40% at the time of LHC in 2013, appeared to have improved to 63% on NST in 2017.  - Will check an echocardiogram - Agree with restarting metoprolol  3. Dyslipidemia: no recent lipids - Check lipid panel - Agree with restarting statin  4. Prediabetes: noted to be on metformin per PCP note in care everywhere.  - Check  HgbA1C - Continue management per primary team  5. Tobacco abuse: continues to smoke 1/2 ppd - Continue to encourage smoking cessation  6. PND: girlfriend reports frequent PND and snoring - would benefit from an outpatient sleep study for OSA evaluation   For questions or updates, please contact [facility] Please consult [email or web address] for contact info under  Cardiology/STEMI.  Signed, Beatriz StallionKrista M. Wymon Swaney, PA-C  12/16/2017 7:49 AM [phone]

## 2017-12-16 NOTE — Progress Notes (Signed)
ANTICOAGULATION CONSULT NOTE - Initial Consult  Pharmacy Consult for Heparin Indication: chest pain/ACS  No Known Allergies  Patient Measurements: Height: 5\' 9"  (175.3 cm) Weight: 206 lb 14.1 oz (93.8 kg) IBW/kg (Calculated) : 70.7 Heparin Dosing Weight: 90 kg  Vital Signs: Temp: 97.9 F (36.6 C) (07/16 0416) Temp Source: Oral (07/16 0416) BP: 124/81 (07/16 0958) Pulse Rate: 57 (07/16 0416)  Labs: Recent Labs    12/15/17 1318 12/15/17 2305 12/16/17 0450 12/16/17 0934  HGB 13.5  --   --  12.8*  HCT 42.1  --   --  40.4  PLT 243  --   --  220  CREATININE 1.36*  --  1.19  --   TROPONINI  --  0.06* 0.10*  --     Estimated Creatinine Clearance: 80.2 mL/min (by C-G formula based on SCr of 1.19 mg/dL).   Medical History: Past Medical History:  Diagnosis Date  . Depression   . MI (myocardial infarction) (HCC)     Medications:  No anticoagulants PTA (only ASA 81 mg)  Assessment: 57 year old male with chest pain to start IV Heparin for ACS. Plan for possible cardiac cath today.   Hgb stable at 12.8 and Platelets within normal limits.  SCr improved at 1.19/est CrCl ~80 mL/min.  No bleeding reported.   Goal of Therapy:  Heparin level 0.3-0.7 units/ml Monitor platelets by anticoagulation protocol: Yes   Plan:  Start Heparin 4000 units bolus x1 then initiate drip at rate of 1200 units/hr.  Follow-up post cath or 6 hour heparin level.   Link SnufferJessica Omarion Minnehan, PharmD, BCPS, BCCCP Clinical Pharmacist Clinical phone 12/16/2017 until 3:30PM - [phone] Please check AMION for all [facility] contact numbers 12/16/2017,10:38 AM

## 2017-12-16 NOTE — H&P (View-Only) (Signed)
 Cardiology Consultation:   Patient ID: Adam Kerr; [id number]; 11/20/1960   Admit date: 12/15/2017 Date of Consult: 12/16/2017  Primary Care Provider: Patient, No Pcp Per Primary Cardiologist: new to [facility]; Dr. Hilty Primary Electrophysiologist:  None   Patient Profile:   Adam Kerr is a 56 y.o. male with a PMH of CAD s/p MI with stent to LAD in 2013, ischemic cardiomyopathy with EF 40% at time of LHC 2013, dyslipidemia, prediabetes, depression, and tobacco abuse, who is being seen today for the evaluation of chest pain at the request of Dr. Vann.  History of Present Illness:   Adam Kerr was in his usual state of health until yesterday morning when he began experiencing bilateral jaw pain, followed by central chest pain while at work. He reported some associated mild sweating but denied SOB, N/V, dizziness, lightheadedness, or syncope. He states his symptoms were reminiscent of what he experienced prior to having his stent placed several years ago. He took 2 SL nitro with improvement in symptoms, ultimately resolved after 2 hours without additional intervention.    He does not follow regularly with a cardiologist and reports not being on cardiac medications with the exception of a baby aspirin daily. His last ischemic evaluation was a NST 2017 which did not reveal any substantial fixed or inducible ischemia; EF estimated to be 63%. He underwent an LHC in 2013 for the evaluation of an MI and was found to have an 80%-90% lesion in the proximal LAD with thrombus present; managed with PCI. He was noted to have an EF of 40% during the catheterization and was started on metoprolol succinate prior to discharge. He reports intermittent compliance with his medications. He also has ongoing tobacco abuse, smoking 10 cigarettes a day as well as chewing nicorette gum.   At the time of this evaluation he is chest pain free. He reports increased fatigue over the past 2 weeks. He denies recent  exertional chest pain or SOB. His girlfriend at bedside reports an episode of chest pain one year ago associated with diaphoresis and lightheadedness for which he took 3 SL nitro with improvement in symptoms but did not seek medical attention. She also reports snoring and frequent PND. Patient denies syncope, LE edema, or orthopnea.   Hospital course: VSS. Labs notable for electrolytes wnl, Cr 1.36>1.19, Hgb 13.5, PLT 243, Trop 0.00>0.01>0.06>0.10. CXR without acute findings. EKG with NSR no STE/D, no TWI. He was given ASA 324mg in the ED and IVFs. Admitted to medicine with cardiology following for further recommendations.   Past Medical History:  Diagnosis Date  . Depression   . MI (myocardial infarction) (HCC)     History reviewed. No pertinent surgical history.   Home Medications:  Prior to Admission medications   Medication Sig Start Date End Date Taking? Authorizing Provider  aspirin EC 81 MG tablet Take 81 mg by mouth daily. 10/14/17  Yes [provider]  DULoxetine (CYMBALTA) 60 MG capsule Take 60 mg by mouth 2 (two) times daily. 09/09/17  Yes [provider]  nitroGLYCERIN (NITROSTAT) 0.4 MG SL tablet Place 0.4 mg under the tongue every 5 (five) minutes as needed for chest pain. 10/14/17 10/14/18 Yes [provider]    Inpatient Medications: Scheduled Meds: . aspirin EC  81 mg Oral Daily  . atorvastatin  40 mg Oral q1800  . enoxaparin (LOVENOX) injection  40 mg Subcutaneous Q24H  . metoprolol tartrate  12.5 mg Oral BID  . nicotine polacrilex  4 mg Oral   Q4H while awake   Continuous Infusions: . sodium chloride 100 mL/hr at 12/16/17 0417   PRN Meds: acetaminophen, morphine injection, nitroGLYCERIN, ondansetron (ZOFRAN) IV  Allergies:   No Known Allergies  Social History:   Social History   Socioeconomic History  . Marital status: Single    Spouse name: Not on file  . Number of children: Not on file  . Years of education: Not on file  . Highest  education level: Not on file  Occupational History  . Not on file  Social Needs  . Financial resource strain: Not on file  . Food insecurity:    Worry: Not on file    Inability: Not on file  . Transportation needs:    Medical: Not on file    Non-medical: Not on file  Tobacco Use  . Smoking status: Current Every Day Smoker    Packs/day: 1.00    Years: 38.00    Pack years: 38.00    Types: Cigarettes  . Smokeless tobacco: Never Used  . Tobacco comment: Pt chews 10 pieces of Nicotine gum  Substance and Sexual Activity  . Alcohol use: Not on file  . Drug use: Not on file  . Sexual activity: Not on file  Lifestyle  . Physical activity:    Days per week: Not on file    Minutes per session: Not on file  . Stress: Not on file  Relationships  . Social connections:    Talks on phone: Not on file    Gets together: Not on file    Attends religious service: Not on file    Active member of club or organization: Not on file    Attends meetings of clubs or organizations: Not on file    Relationship status: Not on file  . Intimate partner violence:    Fear of current or ex partner: Not on file    Emotionally abused: Not on file    Physically abused: Not on file    Forced sexual activity: Not on file  Other Topics Concern  . Not on file  Social History Narrative  . Not on file    Family History:   No family history of premature CAD   ROS:  Please see the history of present illness.    All other ROS reviewed and negative.     Physical Exam/Data:   Vitals:   12/15/17 2045 12/15/17 2100 12/15/17 2132 12/16/17 0416  BP: 126/66 127/80 128/76 124/76  Pulse: 62 (!) 58 (!) 57 (!) 57  Resp: 16 17 18 18  Temp:   98 F (36.7 C) 97.9 F (36.6 C)  TempSrc:    Oral  SpO2: 94% 95% 97% 97%  Weight:   206 lb 14.4 oz (93.8 kg) 206 lb 14.1 oz (93.8 kg)  Height:   5' 9" (1.753 m)     Intake/Output Summary (Last 24 hours) at 12/16/2017 0749 Last data filed at 12/16/2017 0417 Gross per  24 hour  Intake 670.14 ml  Output 650 ml  Net 20.14 ml   Filed Weights   12/15/17 2132 12/16/17 0416  Weight: 206 lb 14.4 oz (93.8 kg) 206 lb 14.1 oz (93.8 kg)   Body mass index is 30.55 kg/m.  General:  Well nourished, well developed, laying in bed in no acute distress HEENT: sclera anicteric  Neck: no JVD Vascular: No carotid bruits; distal pulses 2+ bilaterally Cardiac:  normal S1, S2; RRR; no murmurs, gallops, or rubs Lungs:  clear to auscultation bilaterally,   no wheezing, rhonchi or rales  Abd: NABS, soft, nontender, no hepatomegaly Ext: no edema Musculoskeletal:  No deformities, BUE and BLE strength normal and equal Skin: warm and dry  Neuro:  CNs 2-12 intact, no focal abnormalities noted Psych:  Mood is depressed   EKG:  The EKG was personally reviewed and demonstrates:  NSR no STE/D, no TWI Telemetry:  Telemetry was personally reviewed and demonstrates:  NSR  Relevant CV Studies: Left heart catheterization 2013: CONCLUSIONS: 1.Normal systemic hemodynamics. 2.An 80% to 90% lesion in the proximal left anterior descending with thrombus present. 3.Left ventriculography shows anteroapical hypokinesis with ejection fraction of about 40%.  NST 2017: CONCLUSION:  No substantial fixed or inducible ischemia. Left ventricular ejection fraction of 63%.  Laboratory Data:  Chemistry Recent Labs  Lab 12/15/17 1318 12/16/17 0450  NA 140 144  K 3.7 4.2  CL 108 111  CO2 22 24  GLUCOSE 103* 94  BUN 13 11  CREATININE 1.36* 1.19  CALCIUM 9.3 8.7*  GFRNONAA 58* >60  GFRAA >60 >60  ANIONGAP 10 9    No results for input(s): PROT, ALBUMIN, AST, ALT, ALKPHOS, BILITOT in the last 168 hours. Hematology Recent Labs  Lab 12/15/17 1318  WBC 10.9*  RBC 4.37  HGB 13.5  HCT 42.1  MCV 96.3  MCH 30.9  MCHC 32.1  RDW 12.1  PLT 243   Cardiac Enzymes Recent Labs  Lab 12/15/17 2305 12/16/17 0450  TROPONINI 0.06* 0.10*    Recent Labs  Lab 12/15/17 1324  12/15/17 1935  TROPIPOC 0.00 0.01    BNPNo results for input(s): BNP, PROBNP in the last 168 hours.  DDimer No results for input(s): DDIMER in the last 168 hours.  Radiology/Studies:  Dg Chest 2 View  Result Date: 12/15/2017 CLINICAL DATA:  Chest pain. EXAM: CHEST - 2 VIEW COMPARISON:  None. FINDINGS: The cardiomediastinal silhouette is within normal limits. The lungs are well inflated and clear. There is no evidence of pleural effusion or pneumothorax. No acute osseous abnormality is identified. IMPRESSION: No active cardiopulmonary disease. Electronically Signed   By: Allen  Grady M.D.   On: 12/15/2017 13:40    Assessment and Plan:   1. Chest pain in patient with CAD s/p MI with PCI to LAD in 2013: p/w sudden onset chest pain yesterday morning which felt similar to symptoms experienced with MI. Here EKG is non-ischemic, trop uptrending to 0.1 this morning. Symptoms are concerning for unstable angina.  - Will plan for LHC to further evaluate chest pain  - Will start a heparin gtt given troponin elevations - Continue ASA, statin, and metoprolol - Will check an echocardiogram  - Check a lipid panel and Hgb A1C for risk stratification   2. History of ischemic cardiomyopathy/systolic CHF: EF noted to be 40% at the time of LHC in 2013, appeared to have improved to 63% on NST in 2017.  - Will check an echocardiogram - Agree with restarting metoprolol  3. Dyslipidemia: no recent lipids - Check lipid panel - Agree with restarting statin  4. Prediabetes: noted to be on metformin per PCP note in care everywhere.  - Check  HgbA1C - Continue management per primary team  5. Tobacco abuse: continues to smoke 1/2 ppd - Continue to encourage smoking cessation  6. PND: girlfriend reports frequent PND and snoring - would benefit from an outpatient sleep study for OSA evaluation   For questions or updates, please contact [facility] Please consult [email or web address] for contact info under  Cardiology/STEMI.     Signed, Krista M. Kroeger, PA-C  12/16/2017 7:49 AM [phone]  

## 2017-12-16 NOTE — Interval H&P Note (Signed)
History and Physical Interval Note:  12/16/2017 5:04 PM  Adam LarocheJeffrey Delany  has presented today for surgery, with the diagnosis of cp  The various methods of treatment have been discussed with the patient and family. After consideration of risks, benefits and other options for treatment, the patient has consented to  Procedure(s): LEFT HEART CATH AND CORONARY ANGIOGRAPHY (N/A) as a surgical intervention .  The patient's history has been reviewed, patient examined, no change in status, stable for surgery.  I have reviewed the patient's chart and labs.  Questions were answered to the patient's satisfaction.   Cath Lab Visit (complete for each Cath Lab visit)  Clinical Evaluation Leading to the Procedure:   ACS: Yes.    Non-ACS:    Anginal Classification: CCS IV  Anti-ischemic medical therapy: No Therapy  Non-Invasive Test Results: No non-invasive testing performed  Prior CABG: No previous CABG        Theron Aristaeter [facility] Center LLCJordanMD,FACC 12/16/2017 5:04 PM

## 2017-12-16 NOTE — Progress Notes (Signed)
Progress Note    Adam Kerr  ZOX:[id number] DOB: 21-Apr-1961  DOA: 12/15/2017 PCP: Patient, No Pcp Per    Brief Narrative:     Medical records reviewed and are as summarized below:  Adam Kerr is an 57 y.o. male with medical history significant for coronary artery disease with stent, no longer taking his medications, and now presented to the emergency department for evaluation of discomfort in his chest with radiation to the bilateral jaw.  Plan for cath today  Assessment/Plan:   Principal Problem:   Chest pain Active Problems:   CAD (coronary artery disease)   Mild renal insufficiency   Non-ST elevation (NSTEMI) myocardial infarction [facility])  Chest pain, CAD  - Patient reports hx of CAD with stent several years ago, now presenting with chest discomfort radiating to jaws that resolved after a second NTG prior to arrival  - troponin trending up - plan for cath today  Mild renal insufficiency  -improved with hydration  obesity Body mass index is 30.55 kg/m.   Family Communication/Anticipated D/C date and plan/Code Status   DVT prophylaxis: hep gtt Code Status: Full Code.  Family Communication: at bedside Disposition Plan:    Medical Consultants:    cards   Anti-Infectives:    None  Subjective:   No SOB, no fever, no chills  Objective:    Vitals:   12/15/17 2100 12/15/17 2132 12/16/17 0416 12/16/17 0958  BP: 127/80 128/76 124/76 124/81  Pulse: (!) 58 (!) 57 (!) 57   Resp: 17 18 18    Temp:  98 F (36.7 C) 97.9 F (36.6 C)   TempSrc:   Oral   SpO2: 95% 97% 97%   Weight:  93.8 kg (206 lb 14.4 oz) 93.8 kg (206 lb 14.1 oz)   Height:  5\' 9"  (1.753 m)      Intake/Output Summary (Last 24 hours) at 12/16/2017 1321 Last data filed at 12/16/2017 1131 Gross per 24 hour  Intake 670.14 ml  Output 650 ml  Net 20.14 ml   Filed Weights   12/15/17 2132 12/16/17 0416  Weight: 93.8 kg (206 lb 14.4 oz) 93.8 kg (206 lb 14.1 oz)    Exam: In bed,  NAD rrr Clear, no increase work of breathing] No LE edema No rashes  Data Reviewed:   I have personally reviewed following labs and imaging studies:  Labs: Labs show the following:   Basic Metabolic Panel: Recent Labs  Lab 12/15/17 1318 12/16/17 0450  NA 140 144  K 3.7 4.2  CL 108 111  CO2 22 24  GLUCOSE 103* 94  BUN 13 11  CREATININE 1.36* 1.19  CALCIUM 9.3 8.7*   GFR Estimated Creatinine Clearance: 80.2 mL/min (by C-G formula based on SCr of 1.19 mg/dL). Liver Function Tests: No results for input(s): AST, ALT, ALKPHOS, BILITOT, PROT, ALBUMIN in the last 168 hours. No results for input(s): LIPASE, AMYLASE in the last 168 hours. No results for input(s): AMMONIA in the last 168 hours. Coagulation profile No results for input(s): INR, PROTIME in the last 168 hours.  CBC: Recent Labs  Lab 12/15/17 1318 12/16/17 0934  WBC 10.9* 9.4  HGB 13.5 12.8*  HCT 42.1 40.4  MCV 96.3 96.2  PLT 243 220   Cardiac Enzymes: Recent Labs  Lab 12/15/17 2305 12/16/17 0450 12/16/17 0934  TROPONINI 0.06* 0.10* 0.08*   BNP (last 3 results) No results for input(s): PROBNP in the last 8760 hours. CBG: No results for input(s): GLUCAP in the last 168  hours. D-Dimer: No results for input(s): DDIMER in the last 72 hours. Hgb A1c: Recent Labs    12/16/17 0934  HGBA1C 6.1*   Lipid Profile: Recent Labs    12/16/17 0450  CHOL 181  HDL 27*  LDLCALC 120*  TRIG 172*  CHOLHDL 6.7   Thyroid function studies: No results for input(s): TSH, T4TOTAL, T3FREE, THYROIDAB in the last 72 hours.  Invalid input(s): FREET3 Anemia work up: No results for input(s): VITAMINB12, FOLATE, FERRITIN, TIBC, IRON, RETICCTPCT in the last 72 hours. Sepsis Labs: Recent Labs  Lab 12/15/17 1318 12/16/17 0934  WBC 10.9* 9.4    Microbiology No results found for this or any previous visit (from the past 240 hour(s)).  Procedures and diagnostic studies:  Dg Chest 2 View  Result Date:  12/15/2017 CLINICAL DATA:  Chest pain. EXAM: CHEST - 2 VIEW COMPARISON:  None. FINDINGS: The cardiomediastinal silhouette is within normal limits. The lungs are well inflated and clear. There is no evidence of pleural effusion or pneumothorax. No acute osseous abnormality is identified. IMPRESSION: No active cardiopulmonary disease. Electronically Signed   By: Sebastian AcheAllen  Grady M.D.   On: 12/15/2017 13:40    Medications:   . aspirin EC  81 mg Oral Daily  . atorvastatin  40 mg Oral q1800  . DULoxetine  60 mg Oral BID  . metoprolol tartrate  12.5 mg Oral BID  . nicotine polacrilex  4 mg Oral Q4H while awake  . sodium chloride flush  3 mL Intravenous Q12H   Continuous Infusions: . sodium chloride    . sodium chloride 1 mL/kg/hr (12/16/17 1223)  . heparin 1,200 Units/hr (12/16/17 1126)     LOS: 0 days   Joseph ArtJessica U [place]  [facility]   *Please refer to [email or web address], password [email or web address] to get updated schedule on who will round on this patient, as hospitalists switch teams weekly. If 7PM-7AM, please contact night-coverage at [email or web address], password [email or web address] for any overnight needs.  12/16/2017, 1:21 PM

## 2017-12-17 ENCOUNTER — Encounter (HOSPITAL_COMMUNITY): Payer: Self-pay | Admitting: Cardiology

## 2017-12-17 DIAGNOSIS — I200 Unstable angina: Secondary | ICD-10-CM | POA: Diagnosis not present

## 2017-12-17 DIAGNOSIS — I214 Non-ST elevation (NSTEMI) myocardial infarction: Secondary | ICD-10-CM | POA: Diagnosis not present

## 2017-12-17 DIAGNOSIS — N289 Disorder of kidney and ureter, unspecified: Secondary | ICD-10-CM | POA: Diagnosis not present

## 2017-12-17 LAB — CBC
HCT: 36.8 % — ABNORMAL LOW (ref 39.0–52.0)
Hemoglobin: 11.7 g/dL — ABNORMAL LOW (ref 13.0–17.0)
MCH: 30.7 pg (ref 26.0–34.0)
MCHC: 31.8 g/dL (ref 30.0–36.0)
MCV: 96.6 fL (ref 78.0–100.0)
PLATELETS: 190 10*3/uL (ref 150–400)
RBC: 3.81 MIL/uL — ABNORMAL LOW (ref 4.22–5.81)
RDW: 12.1 % (ref 11.5–15.5)
WBC: 6.6 10*3/uL (ref 4.0–10.5)

## 2017-12-17 MED ORDER — METOPROLOL SUCCINATE ER 25 MG PO TB24
25.0000 mg | ORAL_TABLET | Freq: Every day | ORAL | Status: DC
  Administered 2017-12-17: 25 mg via ORAL
  Filled 2017-12-17: qty 1

## 2017-12-17 MED ORDER — VARENICLINE TARTRATE 0.5 MG PO TABS
0.5000 mg | ORAL_TABLET | Freq: Every day | ORAL | Status: DC
  Filled 2017-12-17: qty 1

## 2017-12-17 MED ORDER — VARENICLINE TARTRATE 1 MG PO TABS: ORAL_TABLET | ORAL | 0 refills | Status: DC

## 2017-12-17 MED ORDER — METOPROLOL TARTRATE 25 MG PO TABS: 12.5000 mg | ORAL_TABLET | Freq: Two times a day (BID) | ORAL | 0 refills | Status: DC

## 2017-12-17 MED ORDER — METOPROLOL SUCCINATE ER 25 MG PO TB24
25.0000 mg | ORAL_TABLET | Freq: Every day | ORAL | 0 refills | Status: DC
Start: 2017-12-17 — End: 2020-02-26

## 2017-12-17 MED ORDER — ATORVASTATIN CALCIUM 40 MG PO TABS: 40.0000 mg | ORAL_TABLET | Freq: Every day | ORAL | 0 refills | Status: DC

## 2017-12-17 MED ORDER — ACETAMINOPHEN 325 MG PO TABS: 650.0000 mg | ORAL_TABLET | ORAL | Status: DC | PRN

## 2017-12-17 MED ORDER — NICOTINE 21 MG/24HR TD PT24: 21.0000 mg | MEDICATED_PATCH | Freq: Every day | TRANSDERMAL | 0 refills | Status: DC

## 2017-12-17 MED ORDER — NICOTINE 21 MG/24HR TD PT24
21.0000 mg | MEDICATED_PATCH | Freq: Every day | TRANSDERMAL | Status: DC
  Filled 2017-12-17: qty 1

## 2017-12-17 MED ORDER — VARENICLINE TARTRATE 0.5 MG PO TABS: 0.5000 mg | ORAL_TABLET | Freq: Two times a day (BID) | ORAL | Status: DC

## 2017-12-17 MED ORDER — VARENICLINE TARTRATE 1 MG PO TABS: 1.0000 mg | ORAL_TABLET | Freq: Two times a day (BID) | ORAL | Status: DC

## 2017-12-17 NOTE — Plan of Care (Signed)

## 2017-12-17 NOTE — Progress Notes (Signed)
Patient received discharge information and acknowledged understanding of it. Patient received radial site care education. Patient IVs were removed.  

## 2017-12-17 NOTE — Progress Notes (Addendum)
DAILY PROGRESS NOTE   Patient Name: Adam Kerr Date of Encounter: 12/17/2017  Chief Complaint   No complaints  Patient Profile   57 yo male with CAD and prior PCI to the LAD in 2013, now presents with NSTEMI.  Subjective   Cath noted yesterday - branch stenosis of a diagonal, otherwise jailed by patent LAD stent. No intervention beyond medical therapy. He is interested in Chantix to aid with smoking cessation.  Objective   Vitals:   12/16/17 1745 12/16/17 1749 12/16/17 2109 12/17/17 0502  BP: 124/80 118/73 120/66 119/72  Pulse: (!) 53 (!) 52 67 (!) 49  Resp: '14 16 16 15  '$ Temp:   98.3 F (36.8 C) 98.7 F (37.1 C)  TempSrc:   Oral Oral  SpO2: 96% 100% 95% 96%  Weight:    202 lb 3.2 oz (91.7 kg)  Height:        Intake/Output Summary (Last 24 hours) at 12/17/2017 0853 Last data filed at 12/17/2017 0516 Gross per 24 hour  Intake 222 ml  Output 1550 ml  Net -1328 ml   Filed Weights   12/15/17 2132 12/16/17 0416 12/17/17 0502  Weight: 206 lb 14.4 oz (93.8 kg) 206 lb 14.1 oz (93.8 kg) 202 lb 3.2 oz (91.7 kg)    Physical Exam   General appearance: alert and no distress Lungs: clear to auscultation bilaterally Heart: regular rate and rhythm, S1, S2 normal, no murmur, click, rub or gallop Extremities: extremities normal, atraumatic, no cyanosis or edema Neurologic: Grossly normal  Inpatient Medications    Scheduled Meds: . aspirin EC  81 mg Oral Daily  . atorvastatin  40 mg Oral q1800  . DULoxetine  60 mg Oral BID  . metoprolol tartrate  12.5 mg Oral BID  . nicotine polacrilex  4 mg Oral Q4H while awake  . sodium chloride flush  3 mL Intravenous Q12H    Continuous Infusions: . sodium chloride      PRN Meds: sodium chloride, acetaminophen, nitroGLYCERIN, ondansetron (ZOFRAN) IV, sodium chloride flush   Labs   Results for orders placed or performed during the hospital encounter of 12/15/17 (from the past 48 hour(s))  Basic metabolic panel     Status:  Abnormal   Collection Time: 12/15/17  1:18 PM  Result Value Ref Range   Sodium 140 135 - 145 mmol/L   Potassium 3.7 3.5 - 5.1 mmol/L   Chloride 108 98 - 111 mmol/L    Comment: Please note change in reference range.   CO2 22 22 - 32 mmol/L   Glucose, Bld 103 (H) 70 - 99 mg/dL    Comment: Please note change in reference range.   BUN 13 6 - 20 mg/dL    Comment: Please note change in reference range.   Creatinine, Ser 1.36 (H) 0.61 - 1.24 mg/dL   Calcium 9.3 8.9 - 10.3 mg/dL   GFR calc non Af Amer 58 (L) >60 mL/min   GFR calc Af Amer >60 >60 mL/min    Comment: (NOTE) The eGFR has been calculated using the CKD EPI equation. This calculation has not been validated in all clinical situations. eGFR's persistently <60 mL/min signify possible Chronic Kidney Disease.    Anion gap 10 5 - 15    Comment: Performed at [facility] [address] [place], [place] [address]  CBC     Status: Abnormal   Collection Time: 12/15/17  1:18 PM  Result Value Ref Range   WBC 10.9 (H) 4.0 -  10.5 K/uL   RBC 4.37 4.22 - 5.81 MIL/uL   Hemoglobin 13.5 13.0 - 17.0 g/dL   HCT 42.1 39.0 - 52.0 %   MCV 96.3 78.0 - 100.0 fL   MCH 30.9 26.0 - 34.0 pg   MCHC 32.1 30.0 - 36.0 g/dL   RDW 12.1 11.5 - 15.5 %   Platelets 243 150 - 400 K/uL    Comment: Performed at [facility], [place] [address] [place], [place] [address]  I-stat troponin, ED     Status: None   Collection Time: 12/15/17  1:24 PM  Result Value Ref Range   Troponin i, poc 0.00 0.00 - 0.08 ng/mL   Comment 3            Comment: Due to the release kinetics of cTnI, a negative result within the first hours of the onset of symptoms does not rule out myocardial infarction with certainty. If myocardial infarction is still suspected, repeat the test at appropriate intervals.   I-stat troponin, ED     Status: None   Collection Time: 12/15/17  7:35 PM  Result Value Ref Range   Troponin i, poc 0.01 0.00 - 0.08 ng/mL   Comment 3             Comment: Due to the release kinetics of cTnI, a negative result within the first hours of the onset of symptoms does not rule out myocardial infarction with certainty. If myocardial infarction is still suspected, repeat the test at appropriate intervals.   Troponin I     Status: Abnormal   Collection Time: 12/15/17 11:05 PM  Result Value Ref Range   Troponin I 0.06 (HH) <0.03 ng/mL    Comment: CRITICAL RESULT CALLED TO, READ BACK BY AND VERIFIED WITH: ROGERS D,RN 12/16/17 0002 WAYK Performed at [facility], [place] [address] [place], [place] [address]   HIV antibody (Routine Testing)     Status: None   Collection Time: 12/16/17  4:50 AM  Result Value Ref Range   HIV Screen 4th Generation wRfx Non Reactive Non Reactive    Comment: (NOTE) Performed At: [facility] [place], [place] [id number] Rush Farmer MD FM:[id number]   Basic metabolic panel     Status: Abnormal   Collection Time: 12/16/17  4:50 AM  Result Value Ref Range   Sodium 144 135 - 145 mmol/L   Potassium 4.2 3.5 - 5.1 mmol/L   Chloride 111 98 - 111 mmol/L    Comment: Please note change in reference range.   CO2 24 22 - 32 mmol/L   Glucose, Bld 94 70 - 99 mg/dL    Comment: Please note change in reference range.   BUN 11 6 - 20 mg/dL    Comment: Please note change in reference range.   Creatinine, Ser 1.19 0.61 - 1.24 mg/dL   Calcium 8.7 (L) 8.9 - 10.3 mg/dL   GFR calc non Af Amer >60 >60 mL/min   GFR calc Af Amer >60 >60 mL/min    Comment: (NOTE) The eGFR has been calculated using the CKD EPI equation. This calculation has not been validated in all clinical situations. eGFR's persistently <60 mL/min signify possible Chronic Kidney Disease.    Anion gap 9 5 - 15    Comment: Performed at [facility] [address] [place],  [address]  Troponin I     Status: Abnormal   Collection Time: 12/16/17  4:50 AM  Result Value Ref  Range   Troponin I 0.10 (HH) <0.03 ng/mL     Comment: CRITICAL VALUE NOTED.  VALUE IS CONSISTENT WITH PREVIOUSLY REPORTED AND CALLED VALUE. Performed at [facility], [place] [address], [place], [place] [address]   Lipid panel     Status: Abnormal   Collection Time: 12/16/17  4:50 AM  Result Value Ref Range   Cholesterol 181 0 - 200 mg/dL   Triglycerides 172 (H) <150 mg/dL   HDL 27 (L) >40 mg/dL   Total CHOL/HDL Ratio 6.7 RATIO   VLDL 34 0 - 40 mg/dL   LDL Cholesterol 120 (H) 0 - 99 mg/dL    Comment:        Total Cholesterol/HDL:CHD Risk Coronary Heart Disease Risk Table                     Men   Women  1/2 Average Risk   3.4   3.3  Average Risk       5.0   4.4  2 X Average Risk   9.6   7.1  3 X Average Risk  23.4   11.0        Use the calculated Patient Ratio above and the CHD Risk Table to determine the patient's CHD Risk.        ATP III CLASSIFICATION (LDL):  <100     mg/dL   Optimal  100-129  mg/dL   Near or Above                    Optimal  130-159  mg/dL   Borderline  160-189  mg/dL   High  >190     mg/dL   Very High Performed at [facility] [address], [place], [place] [address]   Troponin I     Status: Abnormal   Collection Time: 12/16/17  9:34 AM  Result Value Ref Range   Troponin I 0.08 (HH) <0.03 ng/mL    Comment: CRITICAL VALUE NOTED.  VALUE IS CONSISTENT WITH PREVIOUSLY REPORTED AND CALLED VALUE. Performed at [facility], [place] [address], [place], [place] [address]   CBC     Status: Abnormal   Collection Time: 12/16/17  9:34 AM  Result Value Ref Range   WBC 9.4 4.0 - 10.5 K/uL   RBC 4.20 (L) 4.22 - 5.81 MIL/uL   Hemoglobin 12.8 (L) 13.0 - 17.0 g/dL   HCT 40.4 39.0 - 52.0 %   MCV 96.2 78.0 - 100.0 fL   MCH 30.5 26.0 - 34.0 pg   MCHC 31.7 30.0 - 36.0 g/dL   RDW 12.3 11.5 - 15.5 %   Platelets 220 150 - 400 K/uL    Comment: Performed at [facility], [place] [address] [place], [place] [address]  Hemoglobin A1c     Status: Abnormal   Collection Time: 12/16/17  9:34 AM   Result Value Ref Range   Hgb A1c MFr Bld 6.1 (H) 4.8 - 5.6 %    Comment: (NOTE) Pre diabetes:          5.7%-6.4% Diabetes:              >6.4% Glycemic control for   <7.0% adults with diabetes    Mean Plasma Glucose 128.37 mg/dL    Comment: Performed at [facility] [address], [place], [place] [address]  Heparin level (unfractionated)     Status: None   Collection Time: 12/16/17  3:49 PM  Result Value Ref Range   Heparin Unfractionated 0.42 0.30 - 0.70 IU/mL    Comment: (NOTE) If heparin results are below expected values, and patient dosage has  been confirmed, suggest follow up testing of antithrombin III levels. Performed at [facility], [place] [address], [place], [place] [address]   POCT Activated clotting time     Status: None   Collection Time: 12/16/17  5:32 PM  Result Value Ref Range   Activated Clotting Time 334 seconds  CBC     Status: Abnormal   Collection Time: 12/17/17  4:15 AM  Result Value Ref Range   WBC 6.6 4.0 - 10.5 K/uL   RBC 3.81 (L) 4.22 - 5.81 MIL/uL   Hemoglobin 11.7 (L) 13.0 - 17.0 g/dL   HCT 36.8 (L) 39.0 - 52.0 %   MCV 96.6 78.0 - 100.0 fL   MCH 30.7 26.0 - 34.0 pg   MCHC 31.8 30.0 - 36.0 g/dL   RDW 12.1 11.5 - 15.5 %   Platelets 190 150 - 400 K/uL    Comment: Performed at [facility], [place] [address], [place], [place] [address]    ECG   N/A  Telemetry   Sinus rhythm - Personally Reviewed  Radiology    Dg Chest 2 View  Result Date: 12/15/2017 CLINICAL DATA:  Chest pain. EXAM: CHEST - 2 VIEW COMPARISON:  None. FINDINGS: The cardiomediastinal silhouette is within normal limits. The lungs are well inflated and clear. There is no evidence of pleural effusion or pneumothorax. No acute osseous abnormality is identified. IMPRESSION: No active cardiopulmonary disease. Electronically Signed   By: Logan Bores M.D.   On: 12/15/2017 13:40    Cardiac Studies   INTRAVASCULAR PRESSURE WIRE/FFR STUDY  LEFT HEART CATH AND  CORONARY ANGIOGRAPHY  Conclusion     Prox Cx lesion is 50% stenosed.  Previously placed Prox LAD stent (unknown type) is widely patent.  Ost 1st Diag lesion is 95% stenosed.  The left ventricular systolic function is normal.  LV end diastolic pressure is normal.  The left ventricular ejection fraction is 50-55% by visual estimate.   1. Single vessel obstructive disease involving a small caliber first diagonal that is jailed by the prior stent. This is too small for PCI. The LAD stent is widely patent. Borderline stenosis in the mid LCx with normal FFR.  2. Good LV function. Focal inferoapical hypokinesis 3. Normal LVEDP  Plan: medical management and risk factor modification.   Recommend Aspirin '81mg'$  daily for moderate CAD.    Assessment   1. Principal Problem: 2.   Chest pain 3. Active Problems: 4.   CAD (coronary artery disease) 5.   Mild renal insufficiency 6.   Non-ST elevation (NSTEMI) myocardial infarction ([place]) 7.   Plan   1. Continue aggressive risk factor modification with ASA, BB and statin. Will offer Chantix at his request to aid with smoking cessation at discharge. He is aware he cannot use nicotine replacement (gum, patches) with it and needs to set a quit date approximately 1 month after taking his starter pack. I'm happy to see him in follow-up in the office in 4-6 weeks.  Time Spent Directly with Patient:  I have spent a total of 15 minutes with the patient reviewing hospital notes, telemetry, EKGs, labs and examining the patient as well as establishing an assessment and plan that was discussed personally with the patient.  > 50% of time was spent in direct patient care.  Length of Stay:  LOS: 0 days   Pixie Casino, MD, [facility], [place] Director of the Advanced Lipid Disorders &  Cardiovascular Risk Reduction Clinic Diplomate of the American Board of Clinical Lipidology Attending Cardiologist  Direct Dial:  [phone]  Fax: [phone]  Website:  [email or web address] 12/17/2017, 8:53 AM

## 2017-12-17 NOTE — Discharge Summary (Addendum)
Physician Discharge Summary  Adam LarocheJeffrey Gainor ZOX:[id number] DOB: [birth year] DOA: 12/15/2017  PCP: Patient, No Pcp Per  Admit date: 12/15/2017 Discharge date: 12/17/2017  Admitted From: home Discharge disposition: home   Recommendations for Outpatient Follow-Up:   1. Smoking cessation 2. FLP/LFTs in 6 weeks   Discharge Diagnosis:   Principal Problem:   Chest pain Active Problems:   CAD (coronary artery disease)   Mild renal insufficiency   Non-ST elevation (NSTEMI) myocardial infarction [facility](HCC)    Discharge Condition: Improved.  Diet recommendation: Low sodium, heart healthy.   Wound care: None.     History of Present Illness:   Adam LarocheJeffrey Kerr is a 57 y.o. male with medical history significant for coronary artery disease with stent, no longer taking his medications, and now presented to the emergency department for evaluation of discomfort in his chest with radiation to the bilateral jaw. 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 There was no significant shortness of breath or nausea, but he reports some sweating. He describes symptoms as similar to those that led to the stent placement several years ago. He took a nitroglycerin with improvement, then experience resolution and the symptoms are a second nitroglycerin. Denies any lower extremity swelling or tenderness and denies recent fevers, chills, cough, or shortness of breath.     Hospital Course by Problem:   Chest pain -s/p cath--   Prox Cx lesion is 50% stenosed.  Previously placed Prox LAD stent (unknown type) is widely patent.  Ost 1st Diag lesion is 95% stenosed.  The left ventricular systolic function is normal.  LV end diastolic pressure is normal.  The left ventricular ejection fraction is 50-55% by visual estimate.  1. Single vessel obstructive  disease involving a small caliber first diagonal that is jailed by the prior stent. This is too small for PCI. The LAD stent is widely patent. Borderline stenosis in the mid LCx with normal FFR.  2. Good LV function. Focal inferoapical hypokinesis 3. Normal LVEDP  Plan: medical management and risk factor modification.   Recommend Aspirin 81mg  daily for moderate CAD.  -smoking cessation -on toprol XL -statin added  Tobacco abuse -asked for chantix Will need to follow up outpatient with PCP  Depression -continue home meds  Medical Consultants:    cards  Discharge Exam:   Vitals:   12/16/17 2109 12/17/17 0502  BP: 120/66 119/72  Pulse: 67 (!) 49  Resp: 16 15  Temp: 98.3 F (36.8 C) 98.7 F (37.1 C)  SpO2: 95% 96%   Vitals:   12/16/17 1745 12/16/17 1749 12/16/17 2109 12/17/17 0502  BP: 124/80 118/73 120/66 119/72  Pulse: (!) 53 (!) 52 67 (!) 49  Resp: 14 16 16 15   Temp:   98.3 F (36.8 C) 98.7 F (37.1 C)  TempSrc:   Oral Oral  SpO2: 96% 100% 95% 96%  Weight:    91.7 kg (202 lb 3.2 oz)  Height:        General exam: Appears calm and comfortable. Flat affect    The results of significant diagnostics from this hospitalization (including imaging, microbiology, ancillary and laboratory) are listed below for reference.     Procedures and Diagnostic Studies:   Dg Chest 2 View  Result Date: 12/15/2017 CLINICAL DATA:  Chest pain. EXAM: CHEST - 2 VIEW COMPARISON:  None. FINDINGS:  The cardiomediastinal silhouette is within normal limits. The lungs are well inflated and clear. There is no evidence of pleural effusion or pneumothorax. No acute osseous abnormality is identified. IMPRESSION: No active cardiopulmonary disease. Electronically Signed   By: Sebastian Ache M.D.   On: 12/15/2017 13:40     Labs:   Basic Metabolic Panel: Recent Labs  Lab 12/15/17 1318 12/16/17 0450  NA 140 144  K 3.7 4.2  CL 108 111  CO2 22 24  GLUCOSE 103* 94  BUN 13 11  CREATININE  1.36* 1.19  CALCIUM 9.3 8.7*   GFR Estimated Creatinine Clearance: 79.4 mL/min (by C-G formula based on SCr of 1.19 mg/dL). Liver Function Tests: No results for input(s): AST, ALT, ALKPHOS, BILITOT, PROT, ALBUMIN in the last 168 hours. No results for input(s): LIPASE, AMYLASE in the last 168 hours. No results for input(s): AMMONIA in the last 168 hours. Coagulation profile No results for input(s): INR, PROTIME in the last 168 hours.  CBC: Recent Labs  Lab 12/15/17 1318 12/16/17 0934 12/17/17 0415  WBC 10.9* 9.4 6.6  HGB 13.5 12.8* 11.7*  HCT 42.1 40.4 36.8*  MCV 96.3 96.2 96.6  PLT 243 220 190   Cardiac Enzymes: Recent Labs  Lab 12/15/17 2305 12/16/17 0450 12/16/17 0934  TROPONINI 0.06* 0.10* 0.08*   BNP: Invalid input(s): POCBNP CBG: No results for input(s): GLUCAP in the last 168 hours. D-Dimer No results for input(s): DDIMER in the last 72 hours. Hgb A1c Recent Labs    12/16/17 0934  HGBA1C 6.1*   Lipid Profile Recent Labs    12/16/17 0450  CHOL 181  HDL 27*  LDLCALC 120*  TRIG 172*  CHOLHDL 6.7   Thyroid function studies No results for input(s): TSH, T4TOTAL, T3FREE, THYROIDAB in the last 72 hours.  Invalid input(s): FREET3 Anemia work up No results for input(s): VITAMINB12, FOLATE, FERRITIN, TIBC, IRON, RETICCTPCT in the last 72 hours. Microbiology No results found for this or any previous visit (from the past 240 hour(s)).   Discharge Instructions:   Discharge Instructions    Diet - low sodium heart healthy   Complete by:  As directed    Discharge instructions   Complete by:  As directed    Smoking cessation   Increase activity slowly   Complete by:  As directed      Allergies as of 12/17/2017   No Known Allergies     Medication List    TAKE these medications   acetaminophen 325 MG tablet Commonly known as:  TYLENOL Take 2 tablets (650 mg total) by mouth every 4 (four) hours as needed for headache or mild pain.   aspirin EC  81 MG tablet Take 81 mg by mouth daily.   atorvastatin 40 MG tablet Commonly known as:  LIPITOR Take 1 tablet (40 mg total) by mouth daily at 6 PM.   DULoxetine 60 MG capsule Commonly known as:  CYMBALTA Take 60 mg by mouth 2 (two) times daily.   metoprolol tartrate 25 MG tablet Commonly known as:  LOPRESSOR Take 0.5 tablets (12.5 mg total) by mouth 2 (two) times daily.   nitroGLYCERIN 0.4 MG SL tablet Commonly known as:  NITROSTAT Place 0.4 mg under the tongue every 5 (five) minutes as needed for chest pain.      Follow-up Information    establish with PCP for follow up Follow up.            Time coordinating discharge: 35 min  Signed:  Shanda Bumps  U [place] Munns  [facility] 12/17/2017, 8:40 AM

## 2018-11-17 DIAGNOSIS — N529 Male erectile dysfunction, unspecified: Secondary | ICD-10-CM | POA: Diagnosis not present

## 2018-11-17 DIAGNOSIS — I2510 Atherosclerotic heart disease of native coronary artery without angina pectoris: Secondary | ICD-10-CM | POA: Diagnosis not present

## 2018-11-17 DIAGNOSIS — Z7982 Long term (current) use of aspirin: Secondary | ICD-10-CM | POA: Diagnosis not present

## 2018-11-17 DIAGNOSIS — I252 Old myocardial infarction: Secondary | ICD-10-CM | POA: Diagnosis not present

## 2019-07-22 DIAGNOSIS — G894 Chronic pain syndrome: Secondary | ICD-10-CM | POA: Diagnosis not present

## 2019-07-22 DIAGNOSIS — M170 Bilateral primary osteoarthritis of knee: Secondary | ICD-10-CM | POA: Diagnosis not present

## 2019-07-22 DIAGNOSIS — M545 Low back pain: Secondary | ICD-10-CM | POA: Diagnosis not present

## 2019-07-22 DIAGNOSIS — M4680 Other specified inflammatory spondylopathies, site unspecified: Secondary | ICD-10-CM | POA: Diagnosis not present

## 2019-07-22 DIAGNOSIS — M5413 Radiculopathy, cervicothoracic region: Secondary | ICD-10-CM | POA: Diagnosis not present

## 2019-07-22 DIAGNOSIS — R760 Raised antibody titer: Secondary | ICD-10-CM | POA: Diagnosis not present

## 2019-07-22 DIAGNOSIS — M25572 Pain in left ankle and joints of left foot: Secondary | ICD-10-CM | POA: Diagnosis not present

## 2019-07-22 DIAGNOSIS — G5603 Carpal tunnel syndrome, bilateral upper limbs: Secondary | ICD-10-CM | POA: Diagnosis not present

## 2019-07-22 DIAGNOSIS — M25571 Pain in right ankle and joints of right foot: Secondary | ICD-10-CM | POA: Diagnosis not present

## 2019-08-10 DIAGNOSIS — M450 Ankylosing spondylitis of multiple sites in spine: Secondary | ICD-10-CM | POA: Diagnosis not present

## 2019-08-10 DIAGNOSIS — M150 Primary generalized (osteo)arthritis: Secondary | ICD-10-CM | POA: Diagnosis not present

## 2019-08-10 DIAGNOSIS — Z79891 Long term (current) use of opiate analgesic: Secondary | ICD-10-CM | POA: Diagnosis not present

## 2019-08-10 DIAGNOSIS — M5413 Radiculopathy, cervicothoracic region: Secondary | ICD-10-CM | POA: Diagnosis not present

## 2019-08-10 DIAGNOSIS — G894 Chronic pain syndrome: Secondary | ICD-10-CM | POA: Diagnosis not present

## 2019-09-08 DIAGNOSIS — G5603 Carpal tunnel syndrome, bilateral upper limbs: Secondary | ICD-10-CM | POA: Diagnosis not present

## 2019-09-08 DIAGNOSIS — M450 Ankylosing spondylitis of multiple sites in spine: Secondary | ICD-10-CM | POA: Diagnosis not present

## 2019-09-08 DIAGNOSIS — M150 Primary generalized (osteo)arthritis: Secondary | ICD-10-CM | POA: Diagnosis not present

## 2019-09-08 DIAGNOSIS — G894 Chronic pain syndrome: Secondary | ICD-10-CM | POA: Diagnosis not present

## 2019-10-07 DIAGNOSIS — G894 Chronic pain syndrome: Secondary | ICD-10-CM | POA: Diagnosis not present

## 2019-10-07 DIAGNOSIS — M450 Ankylosing spondylitis of multiple sites in spine: Secondary | ICD-10-CM | POA: Diagnosis not present

## 2019-10-07 DIAGNOSIS — M150 Primary generalized (osteo)arthritis: Secondary | ICD-10-CM | POA: Diagnosis not present

## 2019-10-07 DIAGNOSIS — G5603 Carpal tunnel syndrome, bilateral upper limbs: Secondary | ICD-10-CM | POA: Diagnosis not present

## 2019-10-21 DIAGNOSIS — Z79899 Other long term (current) drug therapy: Secondary | ICD-10-CM | POA: Diagnosis not present

## 2019-10-21 DIAGNOSIS — R760 Raised antibody titer: Secondary | ICD-10-CM | POA: Diagnosis not present

## 2019-10-21 DIAGNOSIS — M450 Ankylosing spondylitis of multiple sites in spine: Secondary | ICD-10-CM | POA: Diagnosis not present

## 2019-10-21 DIAGNOSIS — E559 Vitamin D deficiency, unspecified: Secondary | ICD-10-CM | POA: Diagnosis not present

## 2019-11-05 DIAGNOSIS — M450 Ankylosing spondylitis of multiple sites in spine: Secondary | ICD-10-CM | POA: Diagnosis not present

## 2019-11-05 DIAGNOSIS — G5603 Carpal tunnel syndrome, bilateral upper limbs: Secondary | ICD-10-CM | POA: Diagnosis not present

## 2019-11-05 DIAGNOSIS — G894 Chronic pain syndrome: Secondary | ICD-10-CM | POA: Diagnosis not present

## 2019-11-05 DIAGNOSIS — M150 Primary generalized (osteo)arthritis: Secondary | ICD-10-CM | POA: Diagnosis not present

## 2019-11-05 DIAGNOSIS — Z79891 Long term (current) use of opiate analgesic: Secondary | ICD-10-CM | POA: Diagnosis not present

## 2019-12-07 DIAGNOSIS — Z79891 Long term (current) use of opiate analgesic: Secondary | ICD-10-CM | POA: Diagnosis not present

## 2019-12-07 DIAGNOSIS — G5603 Carpal tunnel syndrome, bilateral upper limbs: Secondary | ICD-10-CM | POA: Diagnosis not present

## 2019-12-07 DIAGNOSIS — M150 Primary generalized (osteo)arthritis: Secondary | ICD-10-CM | POA: Diagnosis not present

## 2019-12-07 DIAGNOSIS — G894 Chronic pain syndrome: Secondary | ICD-10-CM | POA: Diagnosis not present

## 2019-12-07 DIAGNOSIS — M450 Ankylosing spondylitis of multiple sites in spine: Secondary | ICD-10-CM | POA: Diagnosis not present

## 2020-01-06 DIAGNOSIS — G5603 Carpal tunnel syndrome, bilateral upper limbs: Secondary | ICD-10-CM | POA: Diagnosis not present

## 2020-01-06 DIAGNOSIS — M450 Ankylosing spondylitis of multiple sites in spine: Secondary | ICD-10-CM | POA: Diagnosis not present

## 2020-01-06 DIAGNOSIS — M150 Primary generalized (osteo)arthritis: Secondary | ICD-10-CM | POA: Diagnosis not present

## 2020-01-06 DIAGNOSIS — G894 Chronic pain syndrome: Secondary | ICD-10-CM | POA: Diagnosis not present

## 2020-02-02 DIAGNOSIS — Z6830 Body mass index (BMI) 30.0-30.9, adult: Secondary | ICD-10-CM | POA: Diagnosis not present

## 2020-02-02 DIAGNOSIS — G894 Chronic pain syndrome: Secondary | ICD-10-CM | POA: Diagnosis not present

## 2020-02-02 DIAGNOSIS — M450 Ankylosing spondylitis of multiple sites in spine: Secondary | ICD-10-CM | POA: Diagnosis not present

## 2020-02-02 DIAGNOSIS — Z79891 Long term (current) use of opiate analgesic: Secondary | ICD-10-CM | POA: Diagnosis not present

## 2020-02-23 ENCOUNTER — Encounter (HOSPITAL_COMMUNITY): Payer: Self-pay

## 2020-02-23 ENCOUNTER — Inpatient Hospital Stay (HOSPITAL_COMMUNITY): Payer: BC Managed Care – PPO

## 2020-02-23 ENCOUNTER — Encounter (HOSPITAL_COMMUNITY)
Admission: EM | Disposition: A | Discharge status: Home / Self Care | Payer: Self-pay | Source: Home / Self Care | Attending: Cardiovascular Disease

## 2020-02-23 ENCOUNTER — Inpatient Hospital Stay (HOSPITAL_COMMUNITY)
Admission: EM | Admit: 2020-02-23 | Discharge: 2020-02-26 | DRG: 247 | Disposition: A | Discharge status: Home / Self Care | Payer: BC Managed Care – PPO | Attending: Cardiovascular Disease | Admitting: Cardiovascular Disease

## 2020-02-23 ENCOUNTER — Other Ambulatory Visit: Payer: Self-pay

## 2020-02-23 DIAGNOSIS — I252 Old myocardial infarction: Secondary | ICD-10-CM

## 2020-02-23 DIAGNOSIS — Z955 Presence of coronary angioplasty implant and graft: Secondary | ICD-10-CM

## 2020-02-23 DIAGNOSIS — E782 Mixed hyperlipidemia: Secondary | ICD-10-CM | POA: Diagnosis present

## 2020-02-23 DIAGNOSIS — I25119 Atherosclerotic heart disease of native coronary artery with unspecified angina pectoris: Secondary | ICD-10-CM | POA: Diagnosis present

## 2020-02-23 DIAGNOSIS — I255 Ischemic cardiomyopathy: Secondary | ICD-10-CM | POA: Diagnosis present

## 2020-02-23 DIAGNOSIS — I429 Cardiomyopathy, unspecified: Secondary | ICD-10-CM

## 2020-02-23 DIAGNOSIS — E785 Hyperlipidemia, unspecified: Secondary | ICD-10-CM | POA: Diagnosis present

## 2020-02-23 DIAGNOSIS — F329 Major depressive disorder, single episode, unspecified: Secondary | ICD-10-CM | POA: Diagnosis present

## 2020-02-23 DIAGNOSIS — I2109 ST elevation (STEMI) myocardial infarction involving other coronary artery of anterior wall: Secondary | ICD-10-CM | POA: Diagnosis not present

## 2020-02-23 DIAGNOSIS — I2510 Atherosclerotic heart disease of native coronary artery without angina pectoris: Secondary | ICD-10-CM

## 2020-02-23 DIAGNOSIS — I10 Essential (primary) hypertension: Secondary | ICD-10-CM | POA: Diagnosis not present

## 2020-02-23 DIAGNOSIS — E119 Type 2 diabetes mellitus without complications: Secondary | ICD-10-CM | POA: Diagnosis present

## 2020-02-23 DIAGNOSIS — R079 Chest pain, unspecified: Secondary | ICD-10-CM | POA: Diagnosis not present

## 2020-02-23 DIAGNOSIS — I214 Non-ST elevation (NSTEMI) myocardial infarction: Secondary | ICD-10-CM | POA: Diagnosis not present

## 2020-02-23 DIAGNOSIS — R0789 Other chest pain: Secondary | ICD-10-CM | POA: Diagnosis not present

## 2020-02-23 DIAGNOSIS — I2102 ST elevation (STEMI) myocardial infarction involving left anterior descending coronary artery: Secondary | ICD-10-CM | POA: Diagnosis not present

## 2020-02-23 DIAGNOSIS — R61 Generalized hyperhidrosis: Secondary | ICD-10-CM | POA: Diagnosis not present

## 2020-02-23 DIAGNOSIS — Z7982 Long term (current) use of aspirin: Secondary | ICD-10-CM | POA: Diagnosis not present

## 2020-02-23 DIAGNOSIS — I472 Ventricular tachycardia: Secondary | ICD-10-CM | POA: Diagnosis present

## 2020-02-23 DIAGNOSIS — I493 Ventricular premature depolarization: Secondary | ICD-10-CM | POA: Diagnosis not present

## 2020-02-23 DIAGNOSIS — I213 ST elevation (STEMI) myocardial infarction of unspecified site: Secondary | ICD-10-CM | POA: Diagnosis not present

## 2020-02-23 DIAGNOSIS — R7303 Prediabetes: Secondary | ICD-10-CM | POA: Diagnosis present

## 2020-02-23 DIAGNOSIS — I259 Chronic ischemic heart disease, unspecified: Secondary | ICD-10-CM

## 2020-02-23 DIAGNOSIS — Z20822 Contact with and (suspected) exposure to covid-19: Secondary | ICD-10-CM | POA: Diagnosis present

## 2020-02-23 DIAGNOSIS — Z79899 Other long term (current) drug therapy: Secondary | ICD-10-CM

## 2020-02-23 DIAGNOSIS — F17210 Nicotine dependence, cigarettes, uncomplicated: Secondary | ICD-10-CM | POA: Diagnosis not present

## 2020-02-23 DIAGNOSIS — I2129 ST elevation (STEMI) myocardial infarction involving other sites: Secondary | ICD-10-CM | POA: Diagnosis not present

## 2020-02-23 DIAGNOSIS — R072 Precordial pain: Secondary | ICD-10-CM | POA: Diagnosis not present

## 2020-02-23 DIAGNOSIS — Z9189 Other specified personal risk factors, not elsewhere classified: Secondary | ICD-10-CM

## 2020-02-23 DIAGNOSIS — Z95820 Peripheral vascular angioplasty status with implants and grafts: Secondary | ICD-10-CM

## 2020-02-23 DIAGNOSIS — Z720 Tobacco use: Secondary | ICD-10-CM | POA: Diagnosis present

## 2020-02-23 HISTORY — PX: LEFT HEART CATH AND CORONARY ANGIOGRAPHY: CATH118249

## 2020-02-23 HISTORY — PX: CORONARY/GRAFT ACUTE MI REVASCULARIZATION: CATH118305

## 2020-02-23 LAB — COMPREHENSIVE METABOLIC PANEL
ALT: 47 U/L — ABNORMAL HIGH (ref 0–44)
AST: 28 U/L (ref 15–41)
Albumin: 3.8 g/dL (ref 3.5–5.0)
Alkaline Phosphatase: 68 U/L (ref 38–126)
Anion gap: 10 (ref 5–15)
BUN: 8 mg/dL (ref 6–20)
CO2: 25 mmol/L (ref 22–32)
Calcium: 9.1 mg/dL (ref 8.9–10.3)
Chloride: 103 mmol/L (ref 98–111)
Creatinine, Ser: 1.22 mg/dL (ref 0.61–1.24)
GFR calc Af Amer: >60 mL/min (ref >60)
GFR calc non Af Amer: >60 mL/min (ref >60)
Glucose, Bld: 201 mg/dL — ABNORMAL HIGH (ref 70–99)
Potassium: 3.5 mmol/L (ref 3.5–5.1)
Sodium: 138 mmol/L (ref 135–145)
Total Bilirubin: 0.7 mg/dL (ref 0.3–1.2)
Total Protein: 6.8 g/dL (ref 6.5–8.1)

## 2020-02-23 LAB — CBC WITH DIFFERENTIAL/PLATELET
Abs Immature Granulocytes: 0.02 10*3/uL (ref 0.00–0.07)
Basophils Absolute: 0 10*3/uL (ref 0.0–0.1)
Basophils Relative: 0 %
Eosinophils Absolute: 0.2 10*3/uL (ref 0.0–0.5)
Eosinophils Relative: 2 %
HCT: 42.5 % (ref 39.0–52.0)
Hemoglobin: 13.3 g/dL (ref 13.0–17.0)
Immature Granulocytes: 0 %
Lymphocytes Relative: 33 %
Lymphs Abs: 3.1 10*3/uL (ref 0.7–4.0)
MCH: 29.9 pg (ref 26.0–34.0)
MCHC: 31.3 g/dL (ref 30.0–36.0)
MCV: 95.5 fL (ref 80.0–100.0)
Monocytes Absolute: 0.8 10*3/uL (ref 0.1–1.0)
Monocytes Relative: 8 %
Neutro Abs: 5.2 10*3/uL (ref 1.7–7.7)
Neutrophils Relative %: 57 %
Platelets: 264 10*3/uL (ref 150–400)
RBC: 4.45 MIL/uL (ref 4.22–5.81)
RDW: 12.5 % (ref 11.5–15.5)
WBC: 9.3 10*3/uL (ref 4.0–10.5)
nRBC: 0 % (ref 0.0–0.2)

## 2020-02-23 LAB — LIPID PANEL
Cholesterol: 224 mg/dL — ABNORMAL HIGH (ref 0–200)
HDL: 25 mg/dL — ABNORMAL LOW (ref >40)
LDL Cholesterol: 156 mg/dL — ABNORMAL HIGH (ref 0–99)
Total CHOL/HDL Ratio: 9 RATIO
Triglycerides: 215 mg/dL — ABNORMAL HIGH (ref <150)
VLDL: 43 mg/dL — ABNORMAL HIGH (ref 0–40)

## 2020-02-23 LAB — HEMOGLOBIN A1C
Hgb A1c MFr Bld: 6.4 % — ABNORMAL HIGH (ref 4.8–5.6)
Mean Plasma Glucose: 136.98 mg/dL

## 2020-02-23 LAB — PROTIME-INR
INR: 1.1 (ref 0.8–1.2)
Prothrombin Time: 14.1 seconds (ref 11.4–15.2)

## 2020-02-23 LAB — APTT: aPTT: 94 seconds — ABNORMAL HIGH (ref 24–36)

## 2020-02-23 LAB — SARS CORONAVIRUS 2 BY RT PCR (HOSPITAL ORDER, PERFORMED IN ~~LOC~~ HOSPITAL LAB): SARS Coronavirus 2: NEGATIVE

## 2020-02-23 LAB — TROPONIN I (HIGH SENSITIVITY): Troponin I (High Sensitivity): 57 ng/L — ABNORMAL HIGH (ref <18)

## 2020-02-23 SURGERY — CORONARY/GRAFT ACUTE MI REVASCULARIZATION
Anesthesia: LOCAL

## 2020-02-23 MED ORDER — SODIUM CHLORIDE 0.9% FLUSH: 3.0000 mL | INTRAVENOUS | Status: DC | PRN

## 2020-02-23 MED ORDER — TIROFIBAN (AGGRASTAT) BOLUS VIA INFUSION
INTRAVENOUS | Status: DC | PRN
  Administered 2020-02-23: 2300 ug via INTRAVENOUS

## 2020-02-23 MED ORDER — TICAGRELOR 90 MG PO TABS: 90.0000 mg | ORAL_TABLET | Freq: Two times a day (BID) | ORAL | Status: DC

## 2020-02-23 MED ORDER — ASPIRIN EC 81 MG PO TBEC
81.0000 mg | DELAYED_RELEASE_TABLET | Freq: Every day | ORAL | Status: DC
  Administered 2020-02-24 – 2020-02-26 (×3): 81 mg via ORAL
  Filled 2020-02-23 (×3): qty 1

## 2020-02-23 MED ORDER — HEPARIN (PORCINE) IN NACL 1000-0.9 UT/500ML-% IV SOLN
INTRAVENOUS | Status: AC
  Filled 2020-02-23: qty 1000

## 2020-02-23 MED ORDER — NITROGLYCERIN IN D5W 200-5 MCG/ML-% IV SOLN: 2.0000 ug/min | INTRAVENOUS | Status: DC

## 2020-02-23 MED ORDER — MIDAZOLAM HCL 2 MG/2ML IJ SOLN
INTRAMUSCULAR | Status: AC
  Filled 2020-02-23: qty 2

## 2020-02-23 MED ORDER — LIDOCAINE HCL (PF) 1 % IJ SOLN
INTRAMUSCULAR | Status: AC
  Filled 2020-02-23: qty 30

## 2020-02-23 MED ORDER — LIDOCAINE HCL (PF) 1 % IJ SOLN
INTRAMUSCULAR | Status: DC | PRN
  Administered 2020-02-23: 5 mL via SUBCUTANEOUS

## 2020-02-23 MED ORDER — TICAGRELOR 90 MG PO TABS
ORAL_TABLET | ORAL | Status: DC | PRN
  Administered 2020-02-23: 180 mg via ORAL

## 2020-02-23 MED ORDER — SODIUM CHLORIDE 0.9 % IV SOLN: INTRAVENOUS | Status: DC

## 2020-02-23 MED ORDER — TICAGRELOR 90 MG PO TABS
90.0000 mg | ORAL_TABLET | Freq: Two times a day (BID) | ORAL | Status: DC
  Administered 2020-02-24 – 2020-02-26 (×5): 90 mg via ORAL
  Filled 2020-02-23 (×5): qty 1

## 2020-02-23 MED ORDER — LOSARTAN POTASSIUM 25 MG PO TABS
25.0000 mg | ORAL_TABLET | Freq: Every day | ORAL | Status: DC
  Administered 2020-02-24 – 2020-02-26 (×3): 25 mg via ORAL
  Filled 2020-02-23 (×3): qty 1

## 2020-02-23 MED ORDER — MIDAZOLAM HCL 2 MG/2ML IJ SOLN
INTRAMUSCULAR | Status: DC | PRN
  Administered 2020-02-23: 2 mg via INTRAVENOUS

## 2020-02-23 MED ORDER — FENTANYL CITRATE (PF) 100 MCG/2ML IJ SOLN
INTRAMUSCULAR | Status: AC
  Filled 2020-02-23: qty 2

## 2020-02-23 MED ORDER — ONDANSETRON HCL 4 MG/2ML IJ SOLN: 4.0000 mg | Freq: Four times a day (QID) | INTRAMUSCULAR | Status: DC | PRN

## 2020-02-23 MED ORDER — HEPARIN SODIUM (PORCINE) 1000 UNIT/ML IJ SOLN
INTRAMUSCULAR | Status: AC
  Filled 2020-02-23: qty 1

## 2020-02-23 MED ORDER — PANTOPRAZOLE SODIUM 40 MG PO TBEC
40.0000 mg | DELAYED_RELEASE_TABLET | Freq: Every day | ORAL | Status: DC
  Administered 2020-02-24 – 2020-02-26 (×3): 40 mg via ORAL
  Filled 2020-02-23 (×3): qty 1

## 2020-02-23 MED ORDER — VERAPAMIL HCL 2.5 MG/ML IV SOLN
INTRAVENOUS | Status: AC
  Filled 2020-02-23: qty 2

## 2020-02-23 MED ORDER — HEPARIN SODIUM (PORCINE) 1000 UNIT/ML IJ SOLN
INTRAMUSCULAR | Status: DC | PRN
  Administered 2020-02-23: 6000 [IU] via INTRAVENOUS

## 2020-02-23 MED ORDER — IOHEXOL 350 MG/ML SOLN
INTRAVENOUS | Status: AC
  Filled 2020-02-23: qty 1

## 2020-02-23 MED ORDER — ASPIRIN 300 MG RE SUPP: 300.0000 mg | RECTAL | Status: DC

## 2020-02-23 MED ORDER — ATORVASTATIN CALCIUM 80 MG PO TABS
80.0000 mg | ORAL_TABLET | Freq: Every day | ORAL | Status: DC
  Administered 2020-02-24 – 2020-02-26 (×3): 80 mg via ORAL
  Filled 2020-02-23 (×2): qty 1
  Filled 2020-02-23: qty 8

## 2020-02-23 MED ORDER — NITROGLYCERIN 0.4 MG SL SUBL: 0.4000 mg | SUBLINGUAL_TABLET | SUBLINGUAL | Status: DC | PRN

## 2020-02-23 MED ORDER — IOHEXOL 350 MG/ML SOLN
INTRAVENOUS | Status: DC | PRN
  Administered 2020-02-23: 120 mL

## 2020-02-23 MED ORDER — SODIUM CHLORIDE 0.9 % IV SOLN: 250.0000 mL | INTRAVENOUS | Status: DC | PRN

## 2020-02-23 MED ORDER — VERAPAMIL HCL 2.5 MG/ML IV SOLN
INTRAVENOUS | Status: DC | PRN
  Administered 2020-02-23: 10 mL via INTRA_ARTERIAL

## 2020-02-23 MED ORDER — ACETAMINOPHEN 325 MG PO TABS: 650.0000 mg | ORAL_TABLET | ORAL | Status: DC | PRN

## 2020-02-23 MED ORDER — NITROGLYCERIN IN D5W 200-5 MCG/ML-% IV SOLN
INTRAVENOUS | Status: AC
  Filled 2020-02-23: qty 250

## 2020-02-23 MED ORDER — FENTANYL CITRATE (PF) 100 MCG/2ML IJ SOLN
INTRAMUSCULAR | Status: DC | PRN
Start: 2020-02-23 — End: 2020-02-23
  Administered 2020-02-23: 25 ug via INTRAVENOUS

## 2020-02-23 MED ORDER — TIROFIBAN HCL IN NACL 5-0.9 MG/100ML-% IV SOLN
INTRAVENOUS | Status: AC
  Filled 2020-02-23: qty 100

## 2020-02-23 MED ORDER — ASPIRIN 81 MG PO CHEW: 324.0000 mg | CHEWABLE_TABLET | ORAL | Status: DC

## 2020-02-23 MED ORDER — HEPARIN (PORCINE) IN NACL 1000-0.9 UT/500ML-% IV SOLN
INTRAVENOUS | Status: DC | PRN
  Administered 2020-02-23 (×2): 500 mL

## 2020-02-23 MED ORDER — ENOXAPARIN SODIUM 30 MG/0.3ML ~~LOC~~ SOLN: 30.0000 mg | SUBCUTANEOUS | Status: DC

## 2020-02-23 MED ORDER — SODIUM CHLORIDE 0.9% FLUSH
3.0000 mL | Freq: Two times a day (BID) | INTRAVENOUS | Status: DC
  Administered 2020-02-24 – 2020-02-25 (×4): 3 mL via INTRAVENOUS

## 2020-02-23 MED ORDER — METOPROLOL TARTRATE 12.5 MG HALF TABLET
12.5000 mg | ORAL_TABLET | Freq: Two times a day (BID) | ORAL | Status: DC
  Administered 2020-02-24: 12.5 mg via ORAL
  Filled 2020-02-23 (×2): qty 1

## 2020-02-23 MED ORDER — HYDRALAZINE HCL 20 MG/ML IJ SOLN: 10.0000 mg | INTRAMUSCULAR | Status: AC | PRN

## 2020-02-23 MED ORDER — FENTANYL CITRATE (PF) 100 MCG/2ML IJ SOLN
50.0000 ug | Freq: Once | INTRAMUSCULAR | Status: AC
  Administered 2020-02-23: 50 ug via INTRAVENOUS

## 2020-02-23 MED ORDER — HEPARIN SODIUM (PORCINE) 5000 UNIT/ML IJ SOLN
60.0000 [IU]/kg | Freq: Once | INTRAMUSCULAR | Status: DC
  Administered 2020-02-23: 4000 [IU] via INTRAVENOUS

## 2020-02-23 MED ORDER — TIROFIBAN HCL IN NACL 5-0.9 MG/100ML-% IV SOLN
0.1500 ug/kg/min | INTRAVENOUS | Status: DC
  Administered 2020-02-23 – 2020-02-24 (×2): 0.15 ug/kg/min via INTRAVENOUS
  Filled 2020-02-23: qty 100

## 2020-02-23 MED ORDER — TIROFIBAN HCL IN NACL 5-0.9 MG/100ML-% IV SOLN
INTRAVENOUS | Status: AC | PRN
  Administered 2020-02-23: 0.15 ug/kg/min via INTRAVENOUS

## 2020-02-23 MED ORDER — TICAGRELOR 90 MG PO TABS
ORAL_TABLET | ORAL | Status: AC
  Filled 2020-02-23: qty 2

## 2020-02-23 MED ORDER — NITROGLYCERIN 0.4 MG SL SUBL
0.4000 mg | SUBLINGUAL_TABLET | SUBLINGUAL | Status: DC | PRN
  Administered 2020-02-23: 0.4 mg via SUBLINGUAL

## 2020-02-23 MED ORDER — NITROGLYCERIN 1 MG/10 ML FOR IR/CATH LAB
INTRA_ARTERIAL | Status: AC
  Filled 2020-02-23: qty 10

## 2020-02-23 MED ORDER — LABETALOL HCL 5 MG/ML IV SOLN: 10.0000 mg | INTRAVENOUS | Status: AC | PRN

## 2020-02-23 MED ORDER — NITROGLYCERIN 1 MG/10 ML FOR IR/CATH LAB
INTRA_ARTERIAL | Status: DC | PRN
  Administered 2020-02-23: 150 ug via INTRACORONARY

## 2020-02-23 MED ORDER — ENOXAPARIN SODIUM 40 MG/0.4ML ~~LOC~~ SOLN
40.0000 mg | SUBCUTANEOUS | Status: DC
  Administered 2020-02-24 – 2020-02-25 (×2): 40 mg via SUBCUTANEOUS
  Filled 2020-02-23 (×3): qty 0.4

## 2020-02-23 MED ORDER — HEPARIN SODIUM (PORCINE) 5000 UNIT/ML IJ SOLN: 4000.0000 [IU] | Freq: Once | INTRAMUSCULAR | Status: DC

## 2020-02-23 SURGICAL SUPPLY — 21 items
BALLN SAPPHIRE 2.5X15 (BALLOONS) ×2
BALLN SAPPHIRE 3.0X15 (BALLOONS) ×2
BALLN SAPPHIRE ~~LOC~~ 4.0X8 (BALLOONS) ×2 IMPLANT
BALLOON SAPPHIRE 2.5X15 (BALLOONS) ×1 IMPLANT
BALLOON SAPPHIRE 3.0X15 (BALLOONS) ×1 IMPLANT
CATH 5FR JL3.5 JR4 ANG PIG MP (CATHETERS) ×2 IMPLANT
CATH LAUNCHER 6FR EBU3.5 (CATHETERS) ×2 IMPLANT
CATH OPTICROSS HD (CATHETERS) ×2 IMPLANT
DEVICE RAD COMP TR BAND LRG (VASCULAR PRODUCTS) ×2 IMPLANT
GLIDESHEATH SLEND SS 6F .021 (SHEATH) ×2 IMPLANT
GUIDEWIRE INQWIRE 1.5J.035X260 (WIRE) ×1 IMPLANT
INQWIRE 1.5J .035X260CM (WIRE) ×2
KIT ENCORE 26 ADVANTAGE (KITS) ×2 IMPLANT
KIT HEART LEFT (KITS) ×2 IMPLANT
KIT HEMO VALVE WATCHDOG (MISCELLANEOUS) ×2 IMPLANT
PACK CARDIAC CATHETERIZATION (CUSTOM PROCEDURE TRAY) ×2 IMPLANT
SLED PULL BACK IVUS (MISCELLANEOUS) ×2 IMPLANT
STENT RESOLUTE ONYX 3.5X12 (Permanent Stent) ×2 IMPLANT
TRANSDUCER W/STOPCOCK (MISCELLANEOUS) ×2 IMPLANT
TUBING CIL FLEX 10 FLL-RA (TUBING) ×2 IMPLANT
WIRE COUGAR XT STRL 190CM (WIRE) ×4 IMPLANT

## 2020-02-23 NOTE — ED Triage Notes (Signed)
Pt comes via [facility] for sharp CP that started an hour ago that radiates to his jaw, hx of MI, pale diaphoretic, PTA 324 ASA and 2 nitro

## 2020-02-23 NOTE — ED Provider Notes (Signed)
[facility] EMERGENCY DEPARTMENT Provider Note   CSN: [id number] Arrival date & time: 02/23/20  2122     History Chief Complaint  Patient presents with  . Code STEMI    Adam Kerr is a 59 y.o. male.   Chest Pain Pain location:  Substernal area Pain quality: crushing and pressure   Pain radiates to:  Neck Pain severity:  Severe Onset quality:  Sudden Duration:  1 hour Timing:  Constant Progression:  Worsening Chronicity:  Recurrent (similar to stemi in 2011) Context: at rest   Relieved by:  Nothing Worsened by:  Nothing Ineffective treatments:  None tried Associated symptoms: diaphoresis   Associated symptoms: no back pain, no cough, no fever, no headache, no nausea, no palpitations, no shortness of breath and no vomiting   Risk factors: coronary artery disease        Past Medical History:  Diagnosis Date  . Depression   . MI (myocardial infarction) [facility])     Patient Active Problem List   Diagnosis Date Noted  . Non-ST elevation (NSTEMI) myocardial infarction (HCC)   . CAD (coronary artery disease) 12/15/2017  . Chest pain 12/15/2017  . Mild renal insufficiency 12/15/2017    Past Surgical History:  Procedure Laterality Date  . INTRAVASCULAR PRESSURE WIRE/FFR STUDY N/A 12/16/2017   Procedure: INTRAVASCULAR PRESSURE WIRE/FFR STUDY;  Surgeon: Swaziland, Peter M, MD;  Location: [facility] INVASIVE CV LAB;  Service: Cardiovascular;  Laterality: N/A;  . LEFT HEART CATH AND CORONARY ANGIOGRAPHY N/A 12/16/2017   Procedure: LEFT HEART CATH AND CORONARY ANGIOGRAPHY;  Surgeon: Swaziland, Peter M, MD;  Location: [facility] INVASIVE CV LAB;  Service: Cardiovascular;  Laterality: N/A;       No family history on file.  Social History   Tobacco Use  . Smoking status: Current Every Day Smoker    Packs/day: 1.00    Years: 38.00    Pack years: 38.00    Types: Cigarettes  . Smokeless tobacco: Never Used  . Tobacco comment: Pt chews 10 pieces of Nicotine gum  Substance  Use Topics  . Alcohol use: Not on file  . Drug use: Not on file    Home Medications Prior to Admission medications   Medication Sig Start Date End Date Taking? Authorizing Provider  acetaminophen (TYLENOL) 325 MG tablet Take 2 tablets (650 mg total) by mouth every 4 (four) hours as needed for headache or mild pain. 12/17/17   Joseph Art, DO  aspirin EC 81 MG tablet Take 81 mg by mouth daily. 10/14/17   [provider]  atorvastatin (LIPITOR) 40 MG tablet Take 1 tablet (40 mg total) by mouth daily at 6 PM. 12/17/17   Joseph Art, DO  DULoxetine (CYMBALTA) 60 MG capsule Take 60 mg by mouth 2 (two) times daily. 09/09/17   [provider]  metoprolol succinate (TOPROL-XL) 25 MG 24 hr tablet Take 1 tablet (25 mg total) by mouth daily. 12/17/17   Joseph Art, DO  nicotine (NICODERM CQ - DOSED IN MG/24 HOURS) 21 mg/24hr patch Place 1 patch (21 mg total) onto the skin daily. 12/17/17   Joseph Art, DO  varenicline (CHANTIX) 1 MG tablet 0.5 mg daily x 3 doses then 0.5 BID x 8 doses then 1 mg BID daily 12/17/17   Joseph Art, DO    Allergies    Patient has no known allergies.  Review of Systems   Review of Systems  Constitutional: Positive for diaphoresis. Negative for chills and fever.  HENT: Negative for congestion and rhinorrhea.   Respiratory: Negative for cough and shortness of breath.   Cardiovascular: Positive for chest pain. Negative for palpitations.  Gastrointestinal: Negative for diarrhea, nausea and vomiting.  Genitourinary: Negative for difficulty urinating and dysuria.  Musculoskeletal: Negative for arthralgias and back pain.  Skin: Negative for color change and rash.  Neurological: Negative for light-headedness and headaches.    Physical Exam Updated Vital Signs BP (!) 146/92   Pulse 66   Temp 98.7 F (37.1 C) (Oral)   Resp 13   SpO2 99%   Physical Exam Vitals and nursing note reviewed. Exam conducted with a chaperone present.    Constitutional:      General: He is not in acute distress.    Appearance: Normal appearance.  HENT:     Head: Normocephalic and atraumatic.     Nose: No rhinorrhea.  Eyes:     General:        Right eye: No discharge.        Left eye: No discharge.     Conjunctiva/sclera: Conjunctivae normal.  Cardiovascular:     Rate and Rhythm: Normal rate and regular rhythm.  Pulmonary:     Effort: Pulmonary effort is normal. No respiratory distress.     Breath sounds: No stridor. No wheezing.  Abdominal:     General: Abdomen is flat. There is no distension.     Palpations: Abdomen is soft.  Musculoskeletal:        General: No deformity or signs of injury.  Skin:    General: Skin is warm.     Coloration: Skin is pale.     Comments: Diaphoretic  Neurological:     General: No focal deficit present.     Mental Status: He is alert. Mental status is at baseline.     Motor: No weakness.  Psychiatric:        Mood and Affect: Mood normal.        Behavior: Behavior normal.        Thought Content: Thought content normal.     ED Results / Procedures / Treatments   Labs (all labs ordered are listed, but only abnormal results are displayed) Labs Reviewed  SARS CORONAVIRUS 2 BY RT PCR (HOSPITAL ORDER, PERFORMED IN [facility])  HEMOGLOBIN A1C  CBC WITH DIFFERENTIAL/PLATELET  PROTIME-INR  APTT  COMPREHENSIVE METABOLIC PANEL  LIPID PANEL  TROPONIN I (HIGH SENSITIVITY)    EKG None  Radiology No results found.  Procedures .Critical Care Performed by: Sabino Donovan, MD Authorized by: Sabino Donovan, MD   Critical care provider statement:    Critical care time (minutes):  45   Critical care was necessary to treat or prevent imminent or life-threatening deterioration of the following conditions:  Circulatory failure and cardiac failure   Critical care was time spent personally by me on the following activities:  Discussions with consultants, evaluation of patient's response to  treatment, examination of patient, ordering and performing treatments and interventions, ordering and review of laboratory studies, ordering and review of radiographic studies, pulse oximetry, re-evaluation of patient's condition, obtaining history from patient or surrogate, review of old charts and blood draw for specimens   (including critical care time) Procedure note: Ultrasound Guided Peripheral IV  Ultrasound guided 18 g peripheral 1.88 inch angiocath IV placement performed by me. Indications: Nursing unable to place IV. Details: The right antecubital fossa and upper arm was evaluated with a multifrequency linear probe. Several patent brachial veins  are noted. 1 attempts were made to cannulate a superficial vein under realtime US guidance with successful cannulation of the vein and catheter placement. There is return of non-pulsatile dark red blood. The patient tolerated the procedure well without complications. An ultrasound image is not archived.   Medications Ordered in ED Medications  0.9 %  sodium chloride infusion ( Intravenous New Bag/Given 02/23/20 2132)  nitroGLYCERIN (NITROSTAT) SL tablet 0.4 mg (0.4 mg Sublingual Given 02/23/20 2145)  heparin injection 4,000 Units (has no administration in time range)  fentaNYL (SUBLIMAZE) injection 50 mcg (50 mcg Intravenous Given 02/23/20 2129)    ED Course  I have reviewed the triage vital signs and the nursing notes.  Pertinent labs & imaging results that were available during my care of the patient were reviewed by me and considered in my medical decision making (see chart for details).    MDM Rules/Calculators/A&P                          EMS includes with STEMI showing ST elevation in V3 V4 V2, inferior depressions and lateral depressions.  Aspirin was given in route nitroglycerin given in route.  Blood pressure stable mentation is normal.  Cardiology is at bedside, repeat EKG is slightly improved with regards ST elevations the patient  is still diaphoretic and in pain.  Fentanyl was given.  QTC was normal.  Heparin bolus was given.  Ultrasound-guided IVs placed by myself at bedside, unable to obtain imaging for documentation.  The cardiology team has decided to take this patient to the Cath Lab.  CRITICAL CARE Performed by: Sabino Donovan   Total critical care time: 45 minutes  Critical care time was exclusive of separately billable procedures and treating other patients.  Critical care was necessary to treat or prevent imminent or life-threatening deterioration.  Critical care was time spent personally by me on the following activities: development of treatment plan with patient and/or surrogate as well as nursing, discussions with consultants, evaluation of patient's response to treatment, examination of patient, obtaining history from patient or surrogate, ordering and performing treatments and interventions, ordering and review of laboratory studies, ordering and review of radiographic studies, pulse oximetry and re-evaluation of patient's condition.    Final Clinical Impression(s) / ED Diagnoses Final diagnoses:  ST elevation myocardial infarction (STEMI), unspecified artery (HCC)  Chest pain due to myocardial ischemia, unspecified ischemic chest pain type    Rx / DC Orders ED Discharge Orders    None       Sabino Donovan, MD 02/23/20 2148

## 2020-02-23 NOTE — H&P (Addendum)
Cardiology History & Physical    Patient ID: Adam Kerr MRN: [id number], DOB/AGE: 1960-08-10   Admit date: 02/23/2020  Primary Physician: Patient, No Pcp Per Primary Cardiologist: No primary care provider on file.  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  Reports he was in his normal state of health today and, while getting ready for bed, began to experience substernal chest pain radiating to his jaw.  He had associated diaphoresis.  He called EMS who administered aspirin and a nitroglycerin.  He had some improvement with nitroglycerin initially but remained with 8/10 pain on my evaluation in the ED.  He received three more nitroglycerin without improvement in his pain and was started on nitroglycerin infusion.  Taken for emergent catheterization via right radial approach which demonstrated subtotal occlusion of his proximal LAD stent and underwent successful PCI to this lesion.  Chest pain resolved postprocedure.  Past Medical History   Past Medical History:  Diagnosis Date  . Depression   . MI (myocardial infarction) [facility])     Past Surgical History:  Procedure Laterality Date  . INTRAVASCULAR PRESSURE WIRE/FFR STUDY N/A 12/16/2017   Procedure: INTRAVASCULAR PRESSURE WIRE/FFR STUDY;  Surgeon: Swaziland, Peter M, MD;  Location: [facility] INVASIVE CV LAB;  Service: Cardiovascular;  Laterality: N/A;  . LEFT HEART CATH AND  CORONARY ANGIOGRAPHY N/A 12/16/2017   Procedure: LEFT HEART CATH AND CORONARY ANGIOGRAPHY;  Surgeon: Swaziland, Peter M, MD;  Location: [facility] INVASIVE CV LAB;  Service: Cardiovascular;  Laterality: N/A;     Allergies No Known Allergies  Home Medications    Prior to Admission medications   Medication Sig Start Date End Date Taking? Authorizing Provider  acetaminophen (TYLENOL) 325 MG tablet Take 2 tablets (650 mg total) by mouth every 4 (four) hours as needed for headache or mild pain. 12/17/17   Joseph Art, DO  aspirin EC 81 MG tablet Take 81 mg by mouth daily. 10/14/17   [provider]  atorvastatin (LIPITOR) 40 MG tablet Take 1 tablet (40 mg total) by mouth daily at 6 PM. 12/17/17   Joseph Art, DO  DULoxetine (CYMBALTA) 60 MG capsule Take 60 mg by mouth 2 (two) times daily. 09/09/17   [provider]  metoprolol succinate (TOPROL-XL) 25 MG 24 hr tablet Take 1 tablet (25 mg total) by mouth daily. 12/17/17   Joseph Art, DO  nicotine (NICODERM CQ - DOSED IN MG/24 HOURS) 21 mg/24hr patch Place 1 patch (21 mg total) onto the skin daily. 12/17/17   Joseph Art, DO  varenicline (CHANTIX) 1 MG tablet 0.5 mg daily x 3 doses then 0.5 BID x 8 doses then 1 mg BID daily 12/17/17   Joseph Art, DO    Family History    Reviewed and noncontributory.  Social History    Social History   Socioeconomic History  .  Marital status: Single    Spouse name: Not on file  . Number of children: Not on file  . Years of education: Not on file  . Highest education level: Not on file  Occupational History  . Not on file  Tobacco Use  . Smoking status: Current Every Day Smoker    Packs/day: 1.00    Years: 38.00    Pack years: 38.00    Types: Cigarettes  . Smokeless tobacco: Never Used  . Tobacco comment: Pt chews 10 pieces of Nicotine gum  Substance and Sexual Activity  . Alcohol use: Not on file  . Drug use: Not on file  . Sexual activity: Not on file  Other Topics Concern   . Not on file  Social History Narrative  . Not on file   Social Determinants of Health   Financial Resource Strain:   . Difficulty of Paying Living Expenses: Not on file  Food Insecurity:   . Worried About Programme researcher, broadcasting/film/video in the Last Year: Not on file  . Ran Out of Food in the Last Year: Not on file  Transportation Needs:   . Lack of Transportation (Medical): Not on file  . Lack of Transportation (Non-Medical): Not on file  Physical Activity:   . Days of Exercise per Week: Not on file  . Minutes of Exercise per Session: Not on file  Stress:   . Feeling of Stress : Not on file  Social Connections:   . Frequency of Communication with Friends and Family: Not on file  . Frequency of Social Gatherings with Friends and Family: Not on file  . Attends Religious Services: Not on file  . Active Member of Clubs or Organizations: Not on file  . Attends Banker Meetings: Not on file  . Marital Status: Not on file  Intimate Partner Violence:   . Fear of Current or Ex-Partner: Not on file  . Emotionally Abused: Not on file  . Physically Abused: Not on file  . Sexually Abused: Not on file     Review of Systems    General:  No chills, fever, night sweats or weight changes.  Cardiovascular:  No chest pain, dyspnea on exertion, edema, orthopnea, palpitations, paroxysmal nocturnal dyspnea. Dermatological: No rash, lesions/masses Respiratory: No cough, dyspnea Urologic: No hematuria, dysuria Abdominal:   No nausea, vomiting, diarrhea, bright red blood per rectum, melena, or hematemesis Neurologic:  No visual changes, wkns, changes in mental status. All other systems reviewed and are otherwise negative except as noted above.  Physical Exam    BP (!) 146/92   Pulse 66   Temp 98.7 F (37.1 C) (Oral)   Resp 13   SpO2 99%  General: Alert, NAD HEENT: Normal  Neck: JVP 10 cm H20, no carotid bruit Lungs:  Resp regular and unlabored, CTA bilaterally. Heart: Regular  rhythm, no s3, s4, or murmurs. Abdomen: Soft, non-tender, non-distended, BS +.  Extremities: Warm. No clubbing, cyanosis or edema. Hairless shins, length dependent.   Non-palpable R DP, palpable R PT, palpable DP/PT on left, no femoral bruit   Psych: Normal affect. Neuro: Alert and oriented. No gross focal deficits. No abnormal movements.  Labs    Troponin (Point of Care Test) No results for input(s): TROPIPOC in the last 72 hours. No results for input(s): CKTOTAL, CKMB, TROPONINI in the last 72 hours. Lab Results  Component Value Date   WBC 6.6 12/17/2017   HGB 11.7 (L) 12/17/2017   HCT 36.8 (L) 12/17/2017  MCV 96.6 12/17/2017   PLT 190 12/17/2017   No results for input(s): NA, K, CL, CO2, BUN, CREATININE, CALCIUM, PROT, BILITOT, ALKPHOS, ALT, AST, GLUCOSE in the last 168 hours.  Invalid input(s): LABALBU Lab Results  Component Value Date   CHOL 181 12/16/2017   HDL 27 (L) 12/16/2017   LDLCALC 120 (H) 12/16/2017   TRIG 172 (H) 12/16/2017   No results found for: [facility]   Radiology Studies    CXR pending  ECG & Cardiac Imaging   [facility] 12/16/17  Prox Cx lesion is 50% stenosed.  Previously placed Prox LAD stent (unknown type) is widely patent.  Ost 1st Diag lesion is 95% stenosed.  The left ventricular systolic function is normal.  LV end diastolic pressure is normal.  The left ventricular ejection fraction is 50-55% by visual estimate. Normal LVEDP, small diagonal obstructive too small for PCI, inferoapical hypokinesis.  ECG w/ NSR, septal infarct pattern, STE V1, V2 with concomitant depressions inferior leads.   Assessment & Plan    59 year old male with history of coronary artery disease presenting with STEMI found to have subtotal occlusion of proximal LAD stent for which he underwent successful PCI.  He has known borderline obstructive lesion of a dominant mid left circumflex.  Admit to CVICU for post-STEMI monitoring and risk stratification.  Currently lying  flat without significant dyspnea and no clear signs of heart failure syndrome complication presentation.  Problem list STEMI s/p PCI pLAD Hypertension Tobacco use disorder Hyperlipidemia Chronic coronary artery disease   Plan STEMI s/p PCI pLAD - Aspirin 81 mg daily indefinitely - Ticagrelor 90 mg BID for at least 6 months, preferably one year, and consider prolonged DAPT therapy - Atorvastatin 80 mg now and indefinitely goal LDL <70 long-term - Lipid panel, A1C, TSH - TTE in AM (no LV-gram in lab) - Start metoprolol tartrate 12.5 mg BID titrate as tolerated - Start losartan 25 mg daily in AM (anterior MI) - Tirofiban infusion to be completed 6 hr post PCI.  Hypertension - Metoprolol, losartan as above and titrate as appropriate  Tobacco use disorder - Smoking cessation discussed with aptient  Hyperlipidemia - As above  Chronic coronary artery disease - Residual borderline mLCx disease (FFR negative 2019).  Follow-up LV function, symptoms post-PCI regarding necessity of revascularization.  Malnutrition: None Nutrition: Heart Healthy diet DVT ppx: lovenox GI ppx: Protonix 40 mg daily on DAPT Advanced Care Planning: Full code  Signed, Regino Schultze, MD 02/23/2020, 10:02 PM

## 2020-02-24 ENCOUNTER — Other Ambulatory Visit: Payer: Self-pay

## 2020-02-24 ENCOUNTER — Inpatient Hospital Stay (HOSPITAL_COMMUNITY): Payer: BC Managed Care – PPO

## 2020-02-24 ENCOUNTER — Encounter (HOSPITAL_COMMUNITY): Payer: Self-pay | Admitting: Student

## 2020-02-24 DIAGNOSIS — I214 Non-ST elevation (NSTEMI) myocardial infarction: Secondary | ICD-10-CM

## 2020-02-24 LAB — CBC WITH DIFFERENTIAL/PLATELET
Abs Immature Granulocytes: 0.04 10*3/uL (ref 0.00–0.07)
Basophils Absolute: 0 10*3/uL (ref 0.0–0.1)
Basophils Relative: 0 %
Eosinophils Absolute: 0.1 10*3/uL (ref 0.0–0.5)
Eosinophils Relative: 1 %
HCT: 42.3 % (ref 39.0–52.0)
Hemoglobin: 13.8 g/dL (ref 13.0–17.0)
Immature Granulocytes: 0 %
Lymphocytes Relative: 16 %
Lymphs Abs: 1.9 10*3/uL (ref 0.7–4.0)
MCH: 30.7 pg (ref 26.0–34.0)
MCHC: 32.6 g/dL (ref 30.0–36.0)
MCV: 94.2 fL (ref 80.0–100.0)
Monocytes Absolute: 0.7 10*3/uL (ref 0.1–1.0)
Monocytes Relative: 6 %
Neutro Abs: 9.1 10*3/uL — ABNORMAL HIGH (ref 1.7–7.7)
Neutrophils Relative %: 77 %
Platelets: 252 10*3/uL (ref 150–400)
RBC: 4.49 MIL/uL (ref 4.22–5.81)
RDW: 12.7 % (ref 11.5–15.5)
WBC: 11.9 10*3/uL — ABNORMAL HIGH (ref 4.0–10.5)
nRBC: 0 % (ref 0.0–0.2)

## 2020-02-24 LAB — COMPREHENSIVE METABOLIC PANEL
ALT: 73 U/L — ABNORMAL HIGH (ref 0–44)
AST: 271 U/L — ABNORMAL HIGH (ref 15–41)
Albumin: 3.8 g/dL (ref 3.5–5.0)
Alkaline Phosphatase: 69 U/L (ref 38–126)
Anion gap: 9 (ref 5–15)
BUN: 8 mg/dL (ref 6–20)
CO2: 26 mmol/L (ref 22–32)
Calcium: 9 mg/dL (ref 8.9–10.3)
Chloride: 105 mmol/L (ref 98–111)
Creatinine, Ser: 1.06 mg/dL (ref 0.61–1.24)
GFR calc Af Amer: >60 mL/min (ref >60)
GFR calc non Af Amer: >60 mL/min (ref >60)
Glucose, Bld: 131 mg/dL — ABNORMAL HIGH (ref 70–99)
Potassium: 3.8 mmol/L (ref 3.5–5.1)
Sodium: 140 mmol/L (ref 135–145)
Total Bilirubin: 0.6 mg/dL (ref 0.3–1.2)
Total Protein: 6.6 g/dL (ref 6.5–8.1)

## 2020-02-24 LAB — ECHOCARDIOGRAM COMPLETE
Area-P 1/2: 3.37 cm2
Height: 69 in
S' Lateral: 4 cm
Single Plane A4C EF: 30.5 %
Weight: 3245.17 oz

## 2020-02-24 LAB — POCT I-STAT, CHEM 8
BUN: 8 mg/dL (ref 6–20)
Calcium, Ion: 1.17 mmol/L (ref 1.15–1.40)
Chloride: 100 mmol/L (ref 98–111)
Creatinine, Ser: 0.9 mg/dL (ref 0.61–1.24)
Glucose, Bld: 149 mg/dL — ABNORMAL HIGH (ref 70–99)
HCT: 37 % — ABNORMAL LOW (ref 39.0–52.0)
Hemoglobin: 12.6 g/dL — ABNORMAL LOW (ref 13.0–17.0)
Potassium: 3.3 mmol/L — ABNORMAL LOW (ref 3.5–5.1)
Sodium: 138 mmol/L (ref 135–145)
TCO2: 22 mmol/L (ref 22–32)

## 2020-02-24 LAB — LIPID PANEL
Cholesterol: 208 mg/dL — ABNORMAL HIGH (ref 0–200)
HDL: 27 mg/dL — ABNORMAL LOW (ref >40)
LDL Cholesterol: 137 mg/dL — ABNORMAL HIGH (ref 0–99)
Total CHOL/HDL Ratio: 7.7 RATIO
Triglycerides: 219 mg/dL — ABNORMAL HIGH (ref <150)
VLDL: 44 mg/dL — ABNORMAL HIGH (ref 0–40)

## 2020-02-24 LAB — MRSA PCR SCREENING: MRSA by PCR: NEGATIVE

## 2020-02-24 LAB — BRAIN NATRIURETIC PEPTIDE: B Natriuretic Peptide: 92.4 pg/mL (ref 0.0–100.0)

## 2020-02-24 LAB — HIV ANTIBODY (ROUTINE TESTING W REFLEX): HIV Screen 4th Generation wRfx: NONREACTIVE

## 2020-02-24 LAB — TROPONIN I (HIGH SENSITIVITY): Troponin I (High Sensitivity): >27000 ng/L (ref <18)

## 2020-02-24 LAB — HEMOGLOBIN A1C
Hgb A1c MFr Bld: 6.5 % — ABNORMAL HIGH (ref 4.8–5.6)
Mean Plasma Glucose: 139.85 mg/dL

## 2020-02-24 LAB — POCT ACTIVATED CLOTTING TIME
Activated Clotting Time: 263 seconds
Activated Clotting Time: 290 seconds

## 2020-02-24 MED ORDER — PNEUMOCOCCAL VAC POLYVALENT 25 MCG/0.5ML IJ INJ
0.5000 mL | INJECTION | INTRAMUSCULAR | Status: DC
  Filled 2020-02-24 (×2): qty 0.5

## 2020-02-24 MED ORDER — CHLORHEXIDINE GLUCONATE CLOTH 2 % EX PADS: 6.0000 | MEDICATED_PAD | Freq: Every day | CUTANEOUS | Status: DC

## 2020-02-24 MED ORDER — NICOTINE POLACRILEX 2 MG MT GUM
2.0000 mg | CHEWING_GUM | OROMUCOSAL | Status: DC | PRN
  Administered 2020-02-24 – 2020-02-25 (×6): 2 mg via ORAL
  Filled 2020-02-24 (×11): qty 1

## 2020-02-24 MED ORDER — INFLUENZA VAC SPLIT QUAD 0.5 ML IM SUSY
0.5000 mL | PREFILLED_SYRINGE | INTRAMUSCULAR | Status: DC
  Filled 2020-02-24 (×2): qty 0.5

## 2020-02-24 MED ORDER — CARVEDILOL 3.125 MG PO TABS
3.1250 mg | ORAL_TABLET | Freq: Two times a day (BID) | ORAL | Status: DC
  Administered 2020-02-24 – 2020-02-25 (×2): 3.125 mg via ORAL
  Filled 2020-02-24: qty 1

## 2020-02-24 MED ORDER — QUETIAPINE FUMARATE ER 50 MG PO TB24
50.0000 mg | ORAL_TABLET | Freq: Every day | ORAL | Status: DC
  Administered 2020-02-24 – 2020-02-25 (×2): 50 mg via ORAL
  Filled 2020-02-24 (×2): qty 1

## 2020-02-24 MED ORDER — OXYCODONE-ACETAMINOPHEN 7.5-325 MG PO TABS
1.0000 | ORAL_TABLET | Freq: Four times a day (QID) | ORAL | Status: DC | PRN
  Administered 2020-02-24 – 2020-02-26 (×7): 1 via ORAL
  Filled 2020-02-24 (×7): qty 1

## 2020-02-24 MED ORDER — DULOXETINE HCL 60 MG PO CPEP
60.0000 mg | ORAL_CAPSULE | Freq: Two times a day (BID) | ORAL | Status: DC
  Administered 2020-02-24 – 2020-02-26 (×5): 60 mg via ORAL
  Filled 2020-02-24 (×5): qty 1

## 2020-02-24 MED ORDER — NICOTINE 21 MG/24HR TD PT24
21.0000 mg | MEDICATED_PATCH | Freq: Every day | TRANSDERMAL | Status: DC
  Administered 2020-02-24 – 2020-02-26 (×4): 21 mg via TRANSDERMAL
  Filled 2020-02-24 (×4): qty 1

## 2020-02-24 MED ORDER — PERFLUTREN LIPID MICROSPHERE
1.0000 mL | INTRAVENOUS | Status: AC | PRN
  Administered 2020-02-24: 3 mL via INTRAVENOUS
  Filled 2020-02-24: qty 10

## 2020-02-24 NOTE — Care Management (Signed)
1031 02-24-20 Benefits check submitted for [employer]. Case Manager will follow for cost and will provide co pay card to patient prior to transition home. Gala Lewandowsky , RN,BSN Case Manager

## 2020-02-24 NOTE — Progress Notes (Addendum)
Pt arrived to unit from [facility] with [place], Charity fundraiser. Pt stated he was missing his personal cell phone. Pt stated he thinks his girlfriend may have be taken it home with her. Madison, RN also stated his girlfriend took some of his belongings home last night and conformed no belongings were left in his room prior to transfer.

## 2020-02-24 NOTE — Progress Notes (Signed)
Echocardiogram 2D Echocardiogram has been performed.  Adam Kerr 02/24/2020, 11:25 AM

## 2020-02-24 NOTE — Progress Notes (Signed)
CRITICAL VALUE ALERT  Critical Value:  Troponin I > 27,000  Date & Time Notied:  02/24/20 at 0345  Provider Notified: Dr. Julianne Handler  Orders Received/Actions taken: No new orders

## 2020-02-24 NOTE — Progress Notes (Signed)
Progress Note Patient Name: Adam Kerr Date of Encounter: 02/24/2020  [facility] Cardiologist: No primary care provider on file.   Subjective   Feels well this morning.  Mild chest soreness but no symptoms reminiscent of his pain yesterday.  Inpatient Medications    Scheduled Meds: . aspirin EC  81 mg Oral Daily  . atorvastatin  80 mg Oral Daily  . Chlorhexidine Gluconate Cloth  6 each Topical Daily  . enoxaparin (LOVENOX) injection  40 mg Subcutaneous Q24H  . heparin  4,000 Units Intravenous Once  . [START ON 02/25/2020] influenza vac split quadrivalent PF  0.5 mL Intramuscular Tomorrow-1000  . losartan  25 mg Oral Daily  . metoprolol tartrate  12.5 mg Oral BID  . nicotine  21 mg Transdermal Daily  . pantoprazole  40 mg Oral Daily  . [START ON 02/25/2020] pneumococcal 23 valent vaccine  0.5 mL Intramuscular Tomorrow-1000  . sodium chloride flush  3 mL Intravenous Q12H  . ticagrelor  90 mg Oral BID   Continuous Infusions: . sodium chloride 20 mL/hr at 02/23/20 2132  . sodium chloride     PRN Meds: sodium chloride, acetaminophen, nicotine polacrilex, nitroGLYCERIN, ondansetron (ZOFRAN) IV, sodium chloride flush   Vital Signs    Vitals:   02/24/20 0500 02/24/20 0600 02/24/20 0700 02/24/20 0800  BP: 140/81 132/87 134/86 129/77  Pulse: 67 68  68  Resp: 16 15 18  (!) 22  Temp:      TempSrc:      SpO2: 96% 96%  97%  Weight:      Height:        Intake/Output Summary (Last 24 hours) at 02/24/2020 0814 Last data filed at 02/24/2020 02/26/2020 Gross per 24 hour  Intake 952.14 ml  Output 800 ml  Net 152.14 ml   Last 3 Weights 02/24/2020 02/23/2020 12/17/2017  Weight (lbs) 202 lb 13.2 oz 202 lb 13.2 oz 202 lb 3.2 oz  Weight (kg) 92 kg 92 kg 91.717 kg      Telemetry    Normal sinus rhythm with single PVCs noted.  No sustained or complex arrhythmia - Personally Reviewed  ECG    Normal sinus rhythm 66 bpm, age-indeterminate anteroseptal infarct - Personally  Reviewed  Physical Exam  Awake, alert and oriented male, sitting in chair at the bedside, in no distress GEN: No acute distress.   Neck: No JVD Cardiac: RRR, no murmurs, rubs, or gallops.  Respiratory:  Scattered rhonchi bilaterally GI: Soft, nontender, non-distended  MS: No edema; No deformity.  Right radial site clear Neuro:  Nonfocal  Psych: Normal affect   Labs    High Sensitivity Troponin:   Recent Labs  Lab 02/23/20 2127 02/24/20 0144  TROPONINIHS 57* >27,000*      Chemistry Recent Labs  Lab 02/23/20 2127 02/24/20 0144  NA 138 140  K 3.5 3.8  CL 103 105  CO2 25 26  GLUCOSE 201* 131*  BUN 8 8  CREATININE 1.22 1.06  CALCIUM 9.1 9.0  PROT 6.8 6.6  ALBUMIN 3.8 3.8  AST 28 271*  ALT 47* 73*  ALKPHOS 68 69  BILITOT 0.7 0.6  GFRNONAA >60 >60  GFRAA >60 >60  ANIONGAP 10 9     Hematology Recent Labs  Lab 02/23/20 2127 02/24/20 0144  WBC 9.3 11.9*  RBC 4.45 4.49  HGB 13.3 13.8  HCT 42.5 42.3  MCV 95.5 94.2  MCH 29.9 30.7  MCHC 31.3 32.6  RDW 12.5 12.7  PLT 264 252  BNP Recent Labs  Lab 02/24/20 0144  BNP 92.4     DDimer No results for input(s): DDIMER in the last 168 hours.   Radiology    CARDIAC CATHETERIZATION  Result Date: 02/23/2020 1.  Anterior STEMI secondary to subtotal occlusion at the ostium of the LAD, treated successfully with primary PCI using a 3.5 x 12 mm resolute Onyx DES postdilated with a 4 mm balloon and evaluated with intravascular ultrasound following deployment 2.  Moderately severe stenosis at the left circumflex/first obtuse marginal bifurcation and a left dominant circumflex with an area of ulceration just proximal to the lesion 3.  Patent codominant right coronary artery without significant stenosis 4.  Mildly elevated LVEDP Recommendations: Aggressive medical therapy, dual antiplatelet therapy with aspirin and ticagrelor probably long-term but a minimum of 12 months without interruption, consider staged PCI of the  left circumflex bifurcation, will review with colleagues and discuss appropriate timing.  Portable chest x-ray 1 view  Result Date: 02/24/2020 CLINICAL DATA:  STEMI EXAM: PORTABLE CHEST 1 VIEW COMPARISON:  12/15/17 FINDINGS: The heart size and mediastinal contours are within normal limits. Both lungs are clear. The visualized skeletal structures are unremarkable. IMPRESSION: No active disease. Electronically Signed   By: Alcide Clever M.D.   On: 02/24/2020 00:02    Cardiac Studies   Cardiac cath reviewed, personally performed last night  Patient Profile     59 y.o. male with known CAD presents with acute anterior STEMI  Assessment & Plan    1.  Acute anterior wall STEMI: Critical stenosis of the ostial LAD with TIMI I flow noted at baseline.  Successful PCI with intravascular ultrasound guidance and placement of a drug-eluting stent at the LAD ostium, overlapping with his old stent.  Continue aspirin and ticagrelor.  Importance of medication adherence reviewed with the patient.  Check 2D echocardiogram this morning.  Transfer to cardiac stepdown bed today.  Aggressive secondary risk reduction measures. 2.  Mixed hyperlipidemia: LDL cholesterol 156 mg/dL.  Start atorvastatin 80 mg daily.  Lifestyle modification discussed with the patient today. 3.  Tobacco abuse: Stressed the importance of complete cessation.  Cessation counseling done. 4.  Residual CAD: The patient with complex disease at the bifurcation of the left circumflex/first obtuse marginal and a dominant circumflex vessel.  The obstruction is moderately severe but certainly not critical.  I do not think he will have significant ischemia in the short-term.  Would anticipate addressing as an outpatient either with stress testing or invasive FFR analysis.  For now would treat medically to allow him time to recover from his myocardial infarction.  Disposition: Phase 1 cardiac rehab, transfer from CV-ICU to a cardiac telemetry bed today.   Anticipate hospital discharge tomorrow as long as he remains clinically stable.  For questions or updates, please contact [facility] Please consult [email or web address] for contact info under        Signed, Tonny Bollman, MD  02/24/2020, 8:14 AM

## 2020-02-24 NOTE — TOC Benefit Eligibility Note (Signed)
Transition of Care [facility]) Benefit Eligibility Note    Patient Details  Name: Adam Kerr MRN: [id number] Date of Birth: 1960-06-05   Medication/Dose: BRILINTA  90 MG BID  Covered?: Yes  Tier: 3 Drug  Prescription Coverage [facility]  Spoke with Person/Company/Phone Number:: CAMISHA  @ [employer] # [phone]  Co-Pay: $40.00  Prior Approval: No  Deductible: Unmet (OUT-OF-POCKET: UNMET)  Additional Notes: TICAGRELOR : Earle Gell Phone Number: 02/24/2020, 1:00 PM

## 2020-02-24 NOTE — Progress Notes (Signed)
CARDIAC REHAB PHASE I   PRE:  Rate/Rhythm: 66 SR with PAC/PVCs    BP: sitting 105/74    SaO2: 94-97 RA  MODE:  Ambulation: 470 ft   POST:  Rate/Rhythm: 86 SR with PAC/PVCs    BP: sitting 108/75     SaO2: 97 RA  Tolerated well. Does have ectopy. Began ed with pt and girlfriend. Receptive, acknowledges he needs change. Plans to quit smoking with nicotine gum. Needs some diet work as well, discussed A1C.  [id number]   Harriet Masson CES, ACSM 02/24/2020 2:35 PM

## 2020-02-25 LAB — BASIC METABOLIC PANEL
Anion gap: 12 (ref 5–15)
BUN: 9 mg/dL (ref 6–20)
CO2: 22 mmol/L (ref 22–32)
Calcium: 8.7 mg/dL — ABNORMAL LOW (ref 8.9–10.3)
Chloride: 107 mmol/L (ref 98–111)
Creatinine, Ser: 1.13 mg/dL (ref 0.61–1.24)
GFR calc Af Amer: >60 mL/min (ref >60)
GFR calc non Af Amer: >60 mL/min (ref >60)
Glucose, Bld: 109 mg/dL — ABNORMAL HIGH (ref 70–99)
Potassium: 3.8 mmol/L (ref 3.5–5.1)
Sodium: 141 mmol/L (ref 135–145)

## 2020-02-25 LAB — MAGNESIUM: Magnesium: 2 mg/dL (ref 1.7–2.4)

## 2020-02-25 MED ORDER — POTASSIUM CHLORIDE CRYS ER 20 MEQ PO TBCR
40.0000 meq | EXTENDED_RELEASE_TABLET | Freq: Once | ORAL | Status: AC
  Administered 2020-02-25: 40 meq via ORAL
  Filled 2020-02-25: qty 2

## 2020-02-25 MED ORDER — TICAGRELOR 90 MG PO TABS: 90.0000 mg | ORAL_TABLET | Freq: Two times a day (BID) | ORAL | 11 refills | Status: AC

## 2020-02-25 MED ORDER — CARVEDILOL 6.25 MG PO TABS
6.2500 mg | ORAL_TABLET | Freq: Two times a day (BID) | ORAL | Status: DC
  Administered 2020-02-25 – 2020-02-26 (×2): 6.25 mg via ORAL
  Filled 2020-02-25 (×2): qty 1

## 2020-02-25 MED ORDER — DAPAGLIFLOZIN PROPANEDIOL 10 MG PO TABS
10.0000 mg | ORAL_TABLET | Freq: Every day | ORAL | Status: DC
  Administered 2020-02-25 – 2020-02-26 (×2): 10 mg via ORAL
  Filled 2020-02-25 (×2): qty 1

## 2020-02-25 MED ORDER — CARVEDILOL 3.125 MG PO TABS
3.1250 mg | ORAL_TABLET | Freq: Once | ORAL | Status: AC
  Administered 2020-02-25: 3.125 mg via ORAL
  Filled 2020-02-25: qty 1

## 2020-02-25 MED FILL — BRILINTA 90 MG TABLET: 90 | 30 days supply | Qty: 60 | Fill #0

## 2020-02-25 NOTE — Progress Notes (Signed)
Heart Failure Patient Advocate Encounter   Received request to investigate benefits check for Farxiga.  Marcelline Deist is covered by insurance and copay is $15 for 30 day supply and $15 for 90 day suppy.  Sharen Hones, PharmD, BCPS Heart Failure Stewardship Pharmacist Phone [phone]

## 2020-02-25 NOTE — Progress Notes (Signed)
Upon rounding on patient, patient concerned about inconsistencies with  flying recommendation for upcoming trip.  I spoke to Dr. Excell Seltzer who states that he would like the patient to reschedule his trip for a few weeks out, offering to write a letter to the airline for the patient.  In the event the patient cannot reschedule he states he does not want to tell the patient not to go (given circumstances for trip).  This information was relayed to the patient and his girlfriend at bedside as well as Archivist.

## 2020-02-25 NOTE — Progress Notes (Signed)
CARDIAC REHAB PHASE I   PRE:  Rate/Rhythm: 74-100 SR with PVCs, triplets, runs of 4    BP: sitting 113/76    SaO2:   MODE:  Ambulation: 470 ft   POST:  Rate/Rhythm: 94 SR with PVCs, mostly triplets    BP: sitting 107/63     SaO2:   Pt able to walk without c/o. Rhythm about the same walking and at rest. Pt asx. Pt is now open to lifevest as he is determined to go on his trip with his girlfriend in 2 weeks to [place]. Discussed HF, low sodium, walking, NTG and CRPII. Set up lifevest video. Will refer to [facility].  [id number]   Harriet Masson CES, ACSM 02/25/2020 2:13 PM

## 2020-02-25 NOTE — Progress Notes (Addendum)
Progress Note  Patient Name: Adam Kerr Date of Encounter: 02/25/2020  [facility] Cardiologist: No primary care provider on file.   Subjective   No chest pain. Frustrated that his cardiac monitor keeps alarming  Inpatient Medications    Scheduled Meds: . aspirin EC  81 mg Oral Daily  . atorvastatin  80 mg Oral Daily  . carvedilol  3.125 mg Oral BID WC  . Chlorhexidine Gluconate Cloth  6 each Topical Daily  . DULoxetine  60 mg Oral BID  . enoxaparin (LOVENOX) injection  40 mg Subcutaneous Q24H  . influenza vac split quadrivalent PF  0.5 mL Intramuscular Tomorrow-1000  . losartan  25 mg Oral Daily  . nicotine  21 mg Transdermal Daily  . pantoprazole  40 mg Oral Daily  . pneumococcal 23 valent vaccine  0.5 mL Intramuscular Tomorrow-1000  . QUEtiapine  50 mg Oral QHS  . sodium chloride flush  3 mL Intravenous Q12H  . ticagrelor  90 mg Oral BID   Continuous Infusions: . sodium chloride 20 mL/hr at 02/23/20 2132  . sodium chloride     PRN Meds: sodium chloride, acetaminophen, nicotine polacrilex, nitroGLYCERIN, ondansetron (ZOFRAN) IV, oxyCODONE-acetaminophen, sodium chloride flush   Vital Signs    Vitals:   02/24/20 2027 02/25/20 0624 02/25/20 0632 02/25/20 0815  BP: 116/81 114/75  107/60  Pulse: 67 74  97  Resp: 18 17    Temp: 98.7 F (37.1 C) 98.9 F (37.2 C)    TempSrc: Oral Oral    SpO2: 95% 96%    Weight:   93.2 kg   Height:        Intake/Output Summary (Last 24 hours) at 02/25/2020 0841 Last data filed at 02/24/2020 2251 Gross per 24 hour  Intake 697 ml  Output 750 ml  Net -53 ml   Last 3 Weights 02/25/2020 02/24/2020 02/23/2020  Weight (lbs) 205 lb 8 oz 202 lb 13.2 oz 202 lb 13.2 oz  Weight (kg) 93.214 kg 92 kg 92 kg      Telemetry    SR, PVCs, freq short runs of NSVT - Personally Reviewed  ECG    SR with evolving ST changes in anterolateral leads - Personally Reviewed  Physical Exam   GEN: No acute distress.   Neck: No JVD Cardiac:  RRR, no murmurs, rubs, or gallops.  Respiratory: Clear to auscultation bilaterally. GI: Soft, nontender, non-distended  MS: No edema; No deformity. Right radial site stable. Neuro:  Nonfocal  Psych: Normal affect   Labs    High Sensitivity Troponin:   Recent Labs  Lab 02/23/20 2127 02/24/20 0144  TROPONINIHS 57* >27,000*      Chemistry Recent Labs  Lab 02/23/20 2127 02/23/20 2219 02/24/20 0144  NA 138 138 140  K 3.5 3.3* 3.8  CL 103 100 105  CO2 25  --  26  GLUCOSE 201* 149* 131*  BUN 8 8 8   CREATININE 1.22 0.90 1.06  CALCIUM 9.1  --  9.0  PROT 6.8  --  6.6  ALBUMIN 3.8  --  3.8  AST 28  --  271*  ALT 47*  --  73*  ALKPHOS 68  --  69  BILITOT 0.7  --  0.6  GFRNONAA >60  --  >60  GFRAA >60  --  >60  ANIONGAP 10  --  9     Hematology Recent Labs  Lab 02/23/20 2127 02/23/20 2219 02/24/20 0144  WBC 9.3  --  11.9*  RBC 4.45  --  4.49  HGB 13.3 12.6* 13.8  HCT 42.5 37.0* 42.3  MCV 95.5  --  94.2  MCH 29.9  --  30.7  MCHC 31.3  --  32.6  RDW 12.5  --  12.7  PLT 264  --  252    BNP Recent Labs  Lab 02/24/20 0144  BNP 92.4     DDimer No results for input(s): DDIMER in the last 168 hours.   Radiology    CARDIAC CATHETERIZATION  Result Date: 02/23/2020 1.  Anterior STEMI secondary to subtotal occlusion at the ostium of the LAD, treated successfully with primary PCI using a 3.5 x 12 mm resolute Onyx DES postdilated with a 4 mm balloon and evaluated with intravascular ultrasound following deployment 2.  Moderately severe stenosis at the left circumflex/first obtuse marginal bifurcation and a left dominant circumflex with an area of ulceration just proximal to the lesion 3.  Patent codominant right coronary artery without significant stenosis 4.  Mildly elevated LVEDP Recommendations: Aggressive medical therapy, dual antiplatelet therapy with aspirin and ticagrelor probably long-term but a minimum of 12 months without interruption, consider staged PCI of the  left circumflex bifurcation, will review with colleagues and discuss appropriate timing.  Portable chest x-ray 1 view  Result Date: 02/24/2020 CLINICAL DATA:  STEMI EXAM: PORTABLE CHEST 1 VIEW COMPARISON:  12/15/17 FINDINGS: The heart size and mediastinal contours are within normal limits. Both lungs are clear. The visualized skeletal structures are unremarkable. IMPRESSION: No active disease. Electronically Signed   By: Alcide Clever M.D.   On: 02/24/2020 00:02   ECHOCARDIOGRAM COMPLETE  Result Date: 02/24/2020    ECHOCARDIOGRAM REPORT   Patient Name:   Adam Kerr Date of Exam: 02/24/2020 Medical Rec #:  [id number]    Height:       69.0 in Accession #:    [id number]   Weight:       202.8 lb Date of Birth:  May 22, 1961   BSA:          2.078 m Patient Age:    58 years     BP:           134/81 mmHg Patient Gender: M            HR:           61 bpm. Exam Location:  Inpatient Procedure: 2D Echo, Color Doppler, Cardiac Doppler and Intracardiac            Opacification Agent Indications:    R07.9* Chest pain, unspecified  History:        Patient has no prior history of Echocardiogram examinations.                 STEMI; Risk Factors:Dyslipidemia.  Sonographer:    Irving Burton Senior RDCS Referring Phys: [phone] Jovonne Wilton IMPRESSIONS  1. Definity contrast was used. There is no apical thrombus present but the presence of a focal area of akinesis / dyskinesis places him at risk for apical thrombi formation. . Left ventricular ejection fraction, by estimation, is 25 to 30%. The left ventricle has severely decreased function. The left ventricle demonstrates regional wall motion abnormalities (see scoring diagram/findings for description). There is mild left ventricular hypertrophy. Left ventricular diastolic parameters are consistent  with Grade I diastolic dysfunction (impaired relaxation). There is severe akinesis of the left ventricular, mid-apical anteroseptal wall and apical segment.  2. Right ventricular systolic  function is normal. The right ventricular size is normal.  3. The mitral valve is grossly normal.  No evidence of mitral valve regurgitation. No evidence of mitral stenosis.  4. The aortic valve is grossly normal. There is mild calcification of the aortic valve. Aortic valve regurgitation is not visualized. No aortic stenosis is present. FINDINGS  Left Ventricle: Definity contrast was used. There is no apical thrombus present but the presence of a focal area of akinesis / dyskinesis places him at risk for apical thrombi formation. Left ventricular ejection fraction, by estimation, is 25 to 30%. The left ventricle has severely decreased function. The left ventricle demonstrates regional wall motion abnormalities. Severe akinesis of the left ventricular, mid-apical anteroseptal wall and apical segment. Definity contrast agent was given IV to delineate the left ventricular endocardial borders. The left ventricular internal cavity size was normal in size. There is mild left ventricular hypertrophy. Left ventricular diastolic parameters are consistent with Grade I diastolic dysfunction (impaired relaxation). Right Ventricle: The right ventricular size is normal. No increase in right ventricular wall thickness. Right ventricular systolic function is normal. Left Atrium: Left atrial size was normal in size. Right Atrium: Right atrial size was normal in size. Pericardium: There is no evidence of pericardial effusion. Mitral Valve: The mitral valve is grossly normal. No evidence of mitral valve regurgitation. No evidence of mitral valve stenosis. Tricuspid Valve: The tricuspid valve is grossly normal. Tricuspid valve regurgitation is not demonstrated. No evidence of tricuspid stenosis. Aortic Valve: The aortic valve is grossly normal. There is mild calcification of the aortic valve. Aortic valve regurgitation is not visualized. No aortic stenosis is present. Pulmonic Valve: The pulmonic valve was normal in structure. Pulmonic  valve regurgitation is not visualized. No evidence of pulmonic stenosis. Aorta: The aortic root and ascending aorta are structurally normal, with no evidence of dilitation. IAS/Shunts: The atrial septum is grossly normal.  LEFT VENTRICLE PLAX 2D LVIDd:         4.50 cm      Diastology LVIDs:         4.00 cm      LV e' medial:    6.20 cm/s LV PW:         1.10 cm      LV E/e' medial:  12.2 LV IVS:        1.30 cm      LV e' lateral:   5.55 cm/s LVOT diam:     2.00 cm      LV E/e' lateral: 13.6 LV SV:         62 LV SV Index:   30 LVOT Area:     3.14 cm  LV Volumes (MOD) LV vol d, MOD A4C: 118.0 ml LV vol s, MOD A4C: 82.0 ml LV SV MOD A4C:     118.0 ml RIGHT VENTRICLE RV S prime:     8.81 cm/s TAPSE (M-mode): 2.0 cm LEFT ATRIUM             Index       RIGHT ATRIUM           Index LA diam:        3.10 cm 1.49 cm/m  RA Area:     11.30 cm LA Vol (A2C):   45.0 ml 21.65 ml/m RA Volume:   24.40 ml  11.74 ml/m LA Vol (A4C):   28.5 ml 13.71 ml/m LA Biplane Vol: 35.8 ml 17.23 ml/m  AORTIC VALVE LVOT Vmax:   94.00 cm/s LVOT Vmean:  60.600 cm/s LVOT VTI:    0.198 m  AORTA Ao Root diam: 3.20 cm  MITRAL VALVE MV Area (PHT): 3.37 cm    SHUNTS MV Decel Time: 225 msec    Systemic VTI:  0.20 m MV E velocity: 75.40 cm/s  Systemic Diam: 2.00 cm MV A velocity: 98.10 cm/s MV E/A ratio:  0.77 Kristeen Miss MD Electronically signed by Kristeen Miss MD Signature Date/Time: 02/24/2020/12:19:09 PM    Final     Cardiac Studies   Cath: 02/23/20  1.  Anterior STEMI secondary to subtotal occlusion at the ostium of the LAD, treated successfully with primary PCI using a 3.5 x 12 mm resolute Onyx DES postdilated with a 4 mm balloon and evaluated with intravascular ultrasound following deployment 2.  Moderately severe stenosis at the left circumflex/first obtuse marginal bifurcation and a left dominant circumflex with an area of ulceration just proximal to the lesion 3.  Patent codominant right coronary artery without significant  stenosis 4.  Mildly elevated LVEDP  Recommendations: Aggressive medical therapy, dual antiplatelet therapy with aspirin and ticagrelor probably long-term but a minimum of 12 months without interruption, consider staged PCI of the left circumflex bifurcation, will review with colleagues and discuss appropriate timing.  Diagnostic Dominance: Left  Intervention    Echo: 02/24/20  IMPRESSIONS    1. Definity contrast was used. There is no apical thrombus present but  the presence of a focal area of akinesis / dyskinesis places him at risk  for apical thrombi formation. . Left ventricular ejection fraction, by  estimation, is 25 to 30%. The left  ventricle has severely decreased function. The left ventricle demonstrates  regional wall motion abnormalities (see scoring diagram/findings for  description). There is mild left ventricular hypertrophy. Left ventricular  diastolic parameters are consistent  with Grade I diastolic dysfunction (impaired relaxation). There is severe  akinesis of the left ventricular, mid-apical anteroseptal wall and apical  segment.  2. Right ventricular systolic function is normal. The right ventricular  size is normal.  3. The mitral valve is grossly normal. No evidence of mitral valve  regurgitation. No evidence of mitral stenosis.  4. The aortic valve is grossly normal. There is mild calcification of the  aortic valve. Aortic valve regurgitation is not visualized. No aortic  stenosis is present.   Patient Profile     59 y.o. male with history of NSTEMI in 2013 at which time he had pLAD PCI, subsequent LHC in 2019 with single vessel obstructive disease of a diagonal and borderline stenosis in mLCx with normal FFR who presents with chest pain and STEMI activated in the field.  Assessment & Plan    1. Anterior STEMI: Successful PCI with intravascular ultrasound guidance PCI/DES x1 to the ostium of the LAD. hsTn >27000. Does have residual CAD with  complex lesion at the bifurcation of the Lcx/1st OM felt to be moderate but no critical. Will treat medically for now. Can address as an outpatient with follow up stress testin/FFR analysis.  -- work with cardiac rehab today -- continue on DAPT with ASA/Brilinta, coreg and statin  2.  Mixed hyperlipidemia: LDL cholesterol 156 mg/dL.  -- Started atorvastatin 80 mg daily.  -- FLP/LFTs in 8 weeks   3. NSVT: noted with freq episodes on telemetry this morning. BMET/mag pending. -- titrate coreg -- continue ARB  4. ICM: echo shows EF of 25-30%, with severe akinesis of the left ventricular, mid-apical anteroseptal wall and apical segment. No apical thrombus noted.  -- No signs of volume overload on exam -- titrate coreg 6.25mg  BID, continue ARB (consider Entresto if bp  remains stable) maybe spiro -- add farxiga  -- talked with patient regarding implications for lifevest at discharge. He will consider, will follow up  5. Tobacco abuse: Stressed the importance of complete cessation.  Cessation counseling done.  6. Newly diagnosed Diabetes: Hgb A1c 6.5, adding farxiga  Will ask CM to address PCP needs. Educated by PharmD regarding medications. Will send Brilinta to [facility] today.   For questions or updates, please contact [facility] Please consult [email or web address] for contact info under     Signed, Laverda Page, NP  02/25/2020, 8:41 AM    Patient seen, examined. Available data reviewed. Agree with findings, assessment, and plan as outlined by Laverda Page, NP.  Patient is alert, oriented, in no distress.  Lungs are clear, heart is regular rate and rhythm with no murmur gallop, abdomen soft nontender, extremities have no edema.  Telemetry reveals sinus rhythm with frequent PVCs and short ventricular runs.  He is completely asymptomatic.  As noted above, the patient became very frustrated with his telemetry alarms overnight.  He is doing better now after getting a little rest.  I had a lengthy  discussion with him this morning.  We discussed the pros and cons of a wearable defibrillator (LifeVest).  After extensive discussion, the patient declines.  Agree with increasing his beta-blocker and another 24 hours of observation.  He should be medically ready for discharge tomorrow morning.  Plan: Increase carvedilol today, observe on telemetry through tomorrow morning.  Anticipate hospital discharge tomorrow.  I have recommended that he return to work in 2 weeks.  He does not do hard physical labor.  He mentions multiple times that he does not like taking medications.  I do not think he is going to be able to stay on a complex medical regimen.  I had a very lengthy discussion with him regarding aspirin and ticagrelor to maintain stent patency.  He is willing to take a fairly simple regimen.  He understands the need to take medication for his ischemic cardiomyopathy as well.  Seems reasonable to continue losartan and we can talk to him about transitioning to [facility] as an outpatient.  With his borderline blood pressure and increase in carvedilol today, will hold off on spironolactone.  Tonny Bollman, M.D. 02/25/2020 11:34 AM

## 2020-02-25 NOTE — Care Management (Signed)
Information for Life Vest faxed to [employer]

## 2020-02-25 NOTE — Progress Notes (Signed)
° ° °  Patient is now agreeable to lifevest. Order faxed and rep notified  Signed, Laverda Page, NP-C 02/25/2020, 2:12 PM Pager: [phone]

## 2020-02-26 ENCOUNTER — Encounter (HOSPITAL_COMMUNITY): Payer: Self-pay | Admitting: Student

## 2020-02-26 DIAGNOSIS — Z95820 Peripheral vascular angioplasty status with implants and grafts: Secondary | ICD-10-CM

## 2020-02-26 DIAGNOSIS — I429 Cardiomyopathy, unspecified: Secondary | ICD-10-CM

## 2020-02-26 DIAGNOSIS — Z720 Tobacco use: Secondary | ICD-10-CM

## 2020-02-26 DIAGNOSIS — R7303 Prediabetes: Secondary | ICD-10-CM

## 2020-02-26 DIAGNOSIS — E785 Hyperlipidemia, unspecified: Secondary | ICD-10-CM | POA: Diagnosis present

## 2020-02-26 DIAGNOSIS — I2109 ST elevation (STEMI) myocardial infarction involving other coronary artery of anterior wall: Principal | ICD-10-CM

## 2020-02-26 DIAGNOSIS — Z9189 Other specified personal risk factors, not elsewhere classified: Secondary | ICD-10-CM

## 2020-02-26 HISTORY — DX: Other specified personal risk factors, not elsewhere classified: Z91.89

## 2020-02-26 HISTORY — DX: Prediabetes: R73.03

## 2020-02-26 HISTORY — DX: Cardiomyopathy, unspecified: I42.9

## 2020-02-26 HISTORY — DX: Peripheral vascular angioplasty status with implants and grafts: Z95.820

## 2020-02-26 HISTORY — DX: Tobacco use: Z72.0

## 2020-02-26 MED ORDER — NICOTINE 21 MG/24HR TD PT24: 21.0000 mg | MEDICATED_PATCH | Freq: Every day | TRANSDERMAL | 0 refills | Status: DC

## 2020-02-26 MED ORDER — CARVEDILOL 6.25 MG PO TABS: 6.2500 mg | ORAL_TABLET | Freq: Two times a day (BID) | ORAL | 6 refills | Status: AC

## 2020-02-26 MED ORDER — NITROGLYCERIN 0.4 MG SL SUBL: 0.4000 mg | SUBLINGUAL_TABLET | SUBLINGUAL | 4 refills | Status: AC | PRN

## 2020-02-26 MED ORDER — PANTOPRAZOLE SODIUM 40 MG PO TBEC: 40.0000 mg | DELAYED_RELEASE_TABLET | Freq: Every day | ORAL | 6 refills | Status: DC

## 2020-02-26 MED ORDER — DAPAGLIFLOZIN PROPANEDIOL 10 MG PO TABS: 10.0000 mg | ORAL_TABLET | Freq: Every day | ORAL | 6 refills | Status: AC

## 2020-02-26 MED ORDER — LOSARTAN POTASSIUM 25 MG PO TABS: 25.0000 mg | ORAL_TABLET | Freq: Every day | ORAL | 6 refills | Status: DC

## 2020-02-26 MED ORDER — ATORVASTATIN CALCIUM 80 MG PO TABS: 80.0000 mg | ORAL_TABLET | Freq: Every day | ORAL | 6 refills | Status: AC

## 2020-02-26 NOTE — Discharge Instructions (Signed)
Take 1 NTG, under your tongue, while sitting.  If no relief of pain may repeat NTG, one tab every 5 minutes up to 3 tablets total over 15 minutes.  If no relief CALL 911.  If you have dizziness/lightheadness  while taking NTG, stop taking and call 911.         Call [facility] at [phone] if any bleeding, swelling or drainage at cath site.  May shower, no tub baths for 48 hours for groin sticks. No lifting over 5 pounds for 5 days.  No Driving for 5 days  Heart Healthy diabetic diet.  You can follow DASH diet - your sugar (glucose) is borderline diabetes.  So decrease sweets and white breads.    Exercise as per Cardiac rehab.     No work until seen in office.   DO NOT STOP asprin or brilliant, stopping could cause a heart attack.

## 2020-02-26 NOTE — Discharge Summary (Addendum)
Discharge Summary    Patient ID: Adam Kerr MRN: [id number]; DOB: 1961-01-30  Admit date: 02/23/2020 Discharge date: 02/26/2020  Primary Care Provider: Patient, No Pcp Per  Primary Cardiologist: Tonny Bollman, MD  Primary Electrophysiologist:  None   Discharge Diagnoses    Principal Problem:   Acute ST elevation myocardial infarction (STEMI) of anterior wall [facility]) Active Problems:   S/P angioplasty with stent, DES to ostium of LAD 02/23/20   CAD (coronary artery disease) residual CAD at bifurcation of LCX/1st OM  may address in future   HLD (hyperlipidemia)   Tobacco abuse   Cardiomyopathy (HCC) (ischemic)    At risk for sudden cardiac death, lifevest placed   Pre-diabetes    Diagnostic Studies/Procedures   cardiac cath 02/23/20  1.  Anterior STEMI secondary to subtotal occlusion at the ostium of the LAD, treated successfully with primary PCI using a 3.5 x 12 mm resolute Onyx DES postdilated with a 4 mm balloon and evaluated with intravascular ultrasound following deployment 2.  Moderately severe stenosis at the left circumflex/first obtuse marginal bifurcation and a left dominant circumflex with an area of ulceration just proximal to the lesion 3.  Patent codominant right coronary artery without significant stenosis 4.  Mildly elevated LVEDP  Recommendations: Aggressive medical therapy, dual antiplatelet therapy with aspirin and ticagrelor probably long-term but a minimum of 12 months without interruption, consider staged PCI of the left circumflex bifurcation, will review with colleagues and discuss appropriate timing.   Echo 02/24/20 IMPRESSIONS    1. Definity contrast was used. There is no apical thrombus present but  the presence of a focal area of akinesis / dyskinesis places him at risk  for apical thrombi formation. . Left ventricular ejection fraction, by  estimation, is 25 to 30%. The left  ventricle has severely decreased function. The left ventricle  demonstrates  regional wall motion abnormalities (see scoring diagram/findings for  description). There is mild left ventricular hypertrophy. Left ventricular  diastolic parameters are consistent  with Grade I diastolic dysfunction (impaired relaxation). There is severe  akinesis of the left ventricular, mid-apical anteroseptal wall and apical  segment.  2. Right ventricular systolic function is normal. The right ventricular  size is normal.  3. The mitral valve is grossly normal. No evidence of mitral valve  regurgitation. No evidence of mitral stenosis.  4. The aortic valve is grossly normal. There is mild calcification of the  aortic valve. Aortic valve regurgitation is not visualized. No aortic  stenosis is present.   FINDINGS  Left Ventricle: Definity contrast was used. There is no apical thrombus  present but the presence of a focal area of akinesis / dyskinesis places  him at risk for apical thrombi formation. Left ventricular ejection  fraction, by estimation, is 25 to 30%.  The left ventricle has severely decreased function. The left ventricle  demonstrates regional wall motion abnormalities. Severe akinesis of the  left ventricular, mid-apical anteroseptal wall and apical segment.  Definity contrast agent was given IV to  delineate the left ventricular endocardial borders. The left ventricular  internal cavity size was normal in size. There is mild left ventricular  hypertrophy. Left ventricular diastolic parameters are consistent with  Grade I diastolic dysfunction  (impaired relaxation).   Right Ventricle: The right ventricular size is normal. No increase in  right ventricular wall thickness. Right ventricular systolic function is  normal.   Left Atrium: Left atrial size was normal in size.   Right Atrium: Right atrial size was normal in  size.   Pericardium: There is no evidence of pericardial effusion.   Mitral Valve: The mitral valve is grossly normal. No  evidence of mitral  valve regurgitation. No evidence of mitral valve stenosis.   Tricuspid Valve: The tricuspid valve is grossly normal. Tricuspid valve  regurgitation is not demonstrated. No evidence of tricuspid stenosis.   Aortic Valve: The aortic valve is grossly normal. There is mild  calcification of the aortic valve. Aortic valve regurgitation is not  visualized. No aortic stenosis is present.   Pulmonic Valve: The pulmonic valve was normal in structure. Pulmonic valve  regurgitation is not visualized. No evidence of pulmonic stenosis.   Aorta: The aortic root and ascending aorta are structurally normal, with  no evidence of dilitation.   IAS/Shunts: The atrial septum is grossly normal.  _____________   History of Present Illness     Adam Kerr is a 59 y.o. male with prior hx of NSTEMI in 2013 and had pLAD PCI, subsequent LHC in 2019 with single vessel disease of diag and borderline stenosis in mLCX with normal FFR.  Pt presented to ER 02/23/20 after developing substernal chest pain with radiation to his jaw and associated diaphoresis.  EMS called pt rec'd ASA and NTG with some improvement.  3 more SL NTG without relief.   Taken emergently to cath lab with ECG w/ NSR, septal infarct pattern, STE V1, V2 with concomitant depressions inferior leads.   Initial troponin 57, K+ 3.5, Cr 1.22,  Hgb 13.3.    Hospital Course     Consultants: none   Pt did well post intervention with PCI with DES to ostium of LAD.   Also found to have moderately severe stenosis of LCX and 1st OM bifurcation and Left dominant circumflex with an area of ulceration just proximal to the lesion.   Hs troponin peaked >27,000.  EF 25-30%.  Mild LVHG1DD.    Pt did well post procedure, he walked with cardiac rehab. Has been instructed on smoking cessation.  Placed on statin.  For residual CAD of LCX  the obstruction is moderately severe but certainly not critical.  I do not think he will have significant ischemia in  the short-term.  Would anticipate addressing as an outpatient either with stress testing or invasive FFR analysis.  For now would treat medically to allow him time to recover from his myocardial infarction  Today pt has been seen and evaluated by Dr. Ladona Ridgel and found stable for discharge.  His lifevest is being placed and he will be discharged with the lifevest.   For cardiomyopathy he is on BB, farxiga, and losartan. euvolemic at discharge.   Wt at 93.4 Kg  BNP 92  A1C of 6.4 to 6.5 (pre-diabetes) will need follow up.  Will ask him to follow diabetic diet.   He will need close follow up.   Did the patient have an acute coronary syndrome (MI, NSTEMI, STEMI, etc) this admission?:  Yes                               AHA/ACC Clinical Performance & Quality Measures: 1. Aspirin prescribed? - Yes 2. ADP Receptor Inhibitor (Plavix/Clopidogrel, Brilinta/Ticagrelor or Effient/Prasugrel) prescribed (includes medically managed patients)? - Yes 3. Beta Blocker prescribed? - Yes 4. High Intensity Statin (Lipitor 40-80mg  or Crestor 20-40mg ) prescribed? - Yes 5. EF assessed during THIS hospitalization? - Yes 6. For EF <40%, was ACEI/ARB prescribed? - Yes 7. For EF <  40%, Aldosterone Antagonist (Spironolactone or Eplerenone) prescribed? - No - Reason:  would consider as outpt once BP improved. 8. Cardiac Rehab Phase II ordered (including medically managed patients)? - Yes   _____________  Discharge Vitals Blood pressure (!) 107/53, pulse 60, temperature 99 F (37.2 C), temperature source Oral, resp. rate 20, height 5\' 9"  (1.753 m), weight 93.4 kg, SpO2 94 %.  Filed Weights   02/24/20 0000 02/25/20 0632 02/26/20 0536  Weight: 92 kg 93.2 kg 93.4 kg   Exam per Dr. 02/28/20. General:Pleasant affect, NAD Skin:Warm and dry, brisk capillary refill HEENT:normocephalic, sclera clear, mucus membranes moist Neck:supple, no JVD, no bruits  Heart:S1S2 RRR without murmur, gallup, rub or click Lungs:clear without  rales, rhonchi, or wheezes Ladona Ridgel, non tender, + BS, do not palpate liver spleen or masses Ext:no lower ext edema, 2+ pedal pulses, 2+ radial pulses Neuro:alert and oriented, MAE, follows commands, + facial symmetry   Labs & Radiologic Studies    CBC Recent Labs    02/23/20 2127 02/23/20 2127 02/23/20 2219 02/24/20 0144  WBC 9.3  --   --  11.9*  NEUTROABS 5.2  --   --  9.1*  HGB 13.3   < > 12.6* 13.8  HCT 42.5   < > 37.0* 42.3  MCV 95.5  --   --  94.2  PLT 264  --   --  252   < > = values in this interval not displayed.   Basic Metabolic Panel Recent Labs    02/26/20 0144 02/25/20 0729  NA 140 141  K 3.8 3.8  CL 105 107  CO2 26 22  GLUCOSE 131* 109*  BUN 8 9  CREATININE 1.06 1.13  CALCIUM 9.0 8.7*  MG  --  2.0   Liver Function Tests Recent Labs    02/23/20 2127 02/24/20 0144  AST 28 271*  ALT 47* 73*  ALKPHOS 68 69  BILITOT 0.7 0.6  PROT 6.8 6.6  ALBUMIN 3.8 3.8   No results for input(s): LIPASE, AMYLASE in the last 72 hours. High Sensitivity Troponin:   Recent Labs  Lab 02/23/20 2127 02/24/20 0144  TROPONINIHS 57* >27,000*    BNP Invalid input(s): POCBNP D-Dimer No results for input(s): DDIMER in the last 72 hours. Hemoglobin A1C Recent Labs    02/24/20 0144  HGBA1C 6.5*   Fasting Lipid Panel Recent Labs    02/24/20 0144  CHOL 208*  HDL 27*  LDLCALC 137*  TRIG 219*  CHOLHDL 7.7   Thyroid Function Tests No results for input(s): TSH, T4TOTAL, T3FREE, THYROIDAB in the last 72 hours.  Invalid input(s): FREET3 _____________  CARDIAC CATHETERIZATION  Result Date: 02/23/2020 1.  Anterior STEMI secondary to subtotal occlusion at the ostium of the LAD, treated successfully with primary PCI using a 3.5 x 12 mm resolute Onyx DES postdilated with a 4 mm balloon and evaluated with intravascular ultrasound following deployment 2.  Moderately severe stenosis at the left circumflex/first obtuse marginal bifurcation and a left dominant circumflex  with an area of ulceration just proximal to the lesion 3.  Patent codominant right coronary artery without significant stenosis 4.  Mildly elevated LVEDP Recommendations: Aggressive medical therapy, dual antiplatelet therapy with aspirin and ticagrelor probably long-term but a minimum of 12 months without interruption, consider staged PCI of the left circumflex bifurcation, will review with colleagues and discuss appropriate timing.  Portable chest x-ray 1 view  Result Date: 02/24/2020 CLINICAL DATA:  STEMI EXAM: PORTABLE CHEST 1 VIEW COMPARISON:  12/15/17 FINDINGS:  The heart size and mediastinal contours are within normal limits. Both lungs are clear. The visualized skeletal structures are unremarkable. IMPRESSION: No active disease. Electronically Signed   By: Alcide Clever M.D.   On: 02/24/2020 00:02   ECHOCARDIOGRAM COMPLETE  Result Date: 02/24/2020    ECHOCARDIOGRAM REPORT   Patient Name:   Adam Kerr Date of Exam: 02/24/2020 Medical Rec #:  [id number]    Height:       69.0 in Accession #:    [id number]   Weight:       202.8 lb Date of Birth:  [birth year]   BSA:          2.078 m Patient Age:    58 years     BP:           134/81 mmHg Patient Gender: M            HR:           61 bpm. Exam Location:  Inpatient Procedure: 2D Echo, Color Doppler, Cardiac Doppler and Intracardiac            Opacification Agent Indications:    R07.9* Chest pain, unspecified  History:        Patient has no prior history of Echocardiogram examinations.                 STEMI; Risk Factors:Dyslipidemia.  Sonographer:    Irving Burton Senior RDCS Referring Phys: [phone] MICHAEL COOPER IMPRESSIONS  1. Definity contrast was used. There is no apical thrombus present but the presence of a focal area of akinesis / dyskinesis places him at risk for apical thrombi formation. . Left ventricular ejection fraction, by estimation, is 25 to 30%. The left ventricle has severely decreased function. The left ventricle demonstrates regional wall motion  abnormalities (see scoring diagram/findings for description). There is mild left ventricular hypertrophy. Left ventricular diastolic parameters are consistent  with Grade I diastolic dysfunction (impaired relaxation). There is severe akinesis of the left ventricular, mid-apical anteroseptal wall and apical segment.  2. Right ventricular systolic function is normal. The right ventricular size is normal.  3. The mitral valve is grossly normal. No evidence of mitral valve regurgitation. No evidence of mitral stenosis.  4. The aortic valve is grossly normal. There is mild calcification of the aortic valve. Aortic valve regurgitation is not visualized. No aortic stenosis is present. FINDINGS  Left Ventricle: Definity contrast was used. There is no apical thrombus present but the presence of a focal area of akinesis / dyskinesis places him at risk for apical thrombi formation. Left ventricular ejection fraction, by estimation, is 25 to 30%. The left ventricle has severely decreased function. The left ventricle demonstrates regional wall motion abnormalities. Severe akinesis of the left ventricular, mid-apical anteroseptal wall and apical segment. Definity contrast agent was given IV to delineate the left ventricular endocardial borders. The left ventricular internal cavity size was normal in size. There is mild left ventricular hypertrophy. Left ventricular diastolic parameters are consistent with Grade I diastolic dysfunction (impaired relaxation). Right Ventricle: The right ventricular size is normal. No increase in right ventricular wall thickness. Right ventricular systolic function is normal. Left Atrium: Left atrial size was normal in size. Right Atrium: Right atrial size was normal in size. Pericardium: There is no evidence of pericardial effusion. Mitral Valve: The mitral valve is grossly normal. No evidence of mitral valve regurgitation. No evidence of mitral valve stenosis. Tricuspid Valve: The tricuspid valve  is grossly normal. Tricuspid valve regurgitation is  not demonstrated. No evidence of tricuspid stenosis. Aortic Valve: The aortic valve is grossly normal. There is mild calcification of the aortic valve. Aortic valve regurgitation is not visualized. No aortic stenosis is present. Pulmonic Valve: The pulmonic valve was normal in structure. Pulmonic valve regurgitation is not visualized. No evidence of pulmonic stenosis. Aorta: The aortic root and ascending aorta are structurally normal, with no evidence of dilitation. IAS/Shunts: The atrial septum is grossly normal.  LEFT VENTRICLE PLAX 2D LVIDd:         4.50 cm      Diastology LVIDs:         4.00 cm      LV e' medial:    6.20 cm/s LV PW:         1.10 cm      LV E/e' medial:  12.2 LV IVS:        1.30 cm      LV e' lateral:   5.55 cm/s LVOT diam:     2.00 cm      LV E/e' lateral: 13.6 LV SV:         62 LV SV Index:   30 LVOT Area:     3.14 cm  LV Volumes (MOD) LV vol d, MOD A4C: 118.0 ml LV vol s, MOD A4C: 82.0 ml LV SV MOD A4C:     118.0 ml RIGHT VENTRICLE RV S prime:     8.81 cm/s TAPSE (M-mode): 2.0 cm LEFT ATRIUM             Index       RIGHT ATRIUM           Index LA diam:        3.10 cm 1.49 cm/m  RA Area:     11.30 cm LA Vol (A2C):   45.0 ml 21.65 ml/m RA Volume:   24.40 ml  11.74 ml/m LA Vol (A4C):   28.5 ml 13.71 ml/m LA Biplane Vol: 35.8 ml 17.23 ml/m  AORTIC VALVE LVOT Vmax:   94.00 cm/s LVOT Vmean:  60.600 cm/s LVOT VTI:    0.198 m  AORTA Ao Root diam: 3.20 cm MITRAL VALVE MV Area (PHT): 3.37 cm    SHUNTS MV Decel Time: 225 msec    Systemic VTI:  0.20 m MV E velocity: 75.40 cm/s  Systemic Diam: 2.00 cm MV A velocity: 98.10 cm/s MV E/A ratio:  0.77 Kristeen Miss MD Electronically signed by Kristeen Miss MD Signature Date/Time: 02/24/2020/12:19:09 PM    Final    Disposition   Pt is being discharged home today in good condition.  Follow-up Plans & Appointments   Take 1 NTG, under your tongue, while sitting.  If no relief of pain may repeat NTG,  one tab every 5 minutes up to 3 tablets total over 15 minutes.  If no relief CALL 911.  If you have dizziness/lightheadness  while taking NTG, stop taking and call 911.         Call [facility] at [phone] if any bleeding, swelling or drainage at cath site.  May shower, no tub baths for 48 hours for groin sticks. No lifting over 5 pounds for 5 days.  No Driving for 5 days  Heart Healthy diabetic diet.  You can follow DASH diet - your sugar (glucose) is borderline diabetes.  So decrease sweets and white breads.    Exercise as per Cardiac rehab.     No work until seen in office.  DO NOT STOP asprin or brilliant, stopping could cause a heart attack.     Follow-up Information    Tonny Bollmanooper, Michael, MD Follow up.   Specialty: Cardiology Why: you will need to be seen in 7-10 days. the office should call Monday with appt date and time. Contact information: [address] [place] [address] [phone]              Discharge Instructions    Amb Referral to Cardiac Rehabilitation   Complete by: As directed    [facility]   Diagnosis:  Coronary Stents STEMI PTCA     After initial evaluation and assessments completed: Virtual Based Care may be provided alone or in conjunction with Phase 2 Cardiac Rehab based on patient barriers.: Yes      Discharge Medications   Allergies as of 02/26/2020   No Known Allergies     Medication List    STOP taking these medications   metoprolol succinate 25 MG 24 hr tablet Commonly known as: TOPROL-XL   varenicline 1 MG tablet Commonly known as: CHANTIX     TAKE these medications   acetaminophen 325 MG tablet Commonly known as: TYLENOL Take 2 tablets (650 mg total) by mouth every 4 (four) hours as needed for headache or mild pain.   aspirin EC 81 MG tablet Take 81 mg by mouth daily.   atorvastatin 80 MG tablet Commonly known as: LIPITOR Take 1 tablet (80 mg total) by mouth  daily. What changed:   medication strength  how much to take  when to take this   carvedilol 6.25 MG tablet Commonly known as: COREG Take 1 tablet (6.25 mg total) by mouth 2 (two) times daily with a meal.   dapagliflozin propanediol 10 MG Tabs tablet Commonly known as: FARXIGA Take 1 tablet (10 mg total) by mouth daily.   DULoxetine 60 MG capsule Commonly known as: CYMBALTA Take 60 mg by mouth 2 (two) times daily.   HYDROcodone-acetaminophen 7.5-325 MG tablet Commonly known as: NORCO Take 1 tablet by mouth 4 (four) times daily as needed for pain.   losartan 25 MG tablet Commonly known as: COZAAR Take 1 tablet (25 mg total) by mouth daily.   nicotine 21 mg/24hr patch Commonly known as: NICODERM CQ - dosed in mg/24 hours Place 1 patch (21 mg total) onto the skin daily.   nitroGLYCERIN 0.4 MG SL tablet Commonly known as: NITROSTAT Place 1 tablet (0.4 mg total) under the tongue every 5 (five) minutes x 3 doses as needed for chest pain.   pantoprazole 40 MG tablet Commonly known as: PROTONIX Take 1 tablet (40 mg total) by mouth daily.   QUEtiapine 25 MG tablet Commonly known as: SEROQUEL Take 50 mg by mouth at bedtime.   ticagrelor 90 MG Tabs tablet Commonly known as: BRILINTA Take 1 tablet (90 mg total) by mouth 2 (two) times daily.   trolamine salicylate 10 % cream Commonly known as: ASPERCREME Apply 1 application topically as needed for muscle pain.          Outstanding Labs/Studies   BMP Hepatic and lipid in 6 weeks Follow up Echo in 3 months after medication adjustments for cardiomyopathy.    Duration of Discharge Encounter   Greater than 30 minutes including physician time.  Signed, Nada BoozerLaura Ingold, NP 02/26/2020, 9:50 AM  Cardiology attending  Patient seen and examined. Agree with the findings as noted above. The patient is stable for DC. I strongly encouraged him to not  start back smoking. A Life Vest has been recommended due to young age,  severe LV dysfunction and late presentation. Meds and followup as noted above.  Sharlot Gowda Mia Winthrop,MD

## 2020-03-02 DIAGNOSIS — M450 Ankylosing spondylitis of multiple sites in spine: Secondary | ICD-10-CM | POA: Diagnosis not present

## 2020-03-02 DIAGNOSIS — G894 Chronic pain syndrome: Secondary | ICD-10-CM | POA: Diagnosis not present

## 2020-03-03 ENCOUNTER — Telehealth (HOSPITAL_COMMUNITY): Payer: Self-pay

## 2020-03-03 NOTE — Telephone Encounter (Signed)
Faxed cardiac rehab referral to [facility] cardiac rehab, per phase II

## 2020-03-07 NOTE — Progress Notes (Signed)
Cardiology Office Note  Date:  03/08/2020   ID:  Adam Kerr, DOB 01/22/1961, MRN [id number]  PCP:  Patient, No Pcp Per  Cardiologist:  Dr. Excell Seltzer  _____________  Hospital follow-up for STEMI  _____________   History of Present Illness: Adam Kerr is a 59 y.o. male with pmh of prior NSTEMI in 2013 and had pLAD PCI subsequent LHC in 2019 with single vessel disease of diag and borderline stenosis in mLCX with normal FFR. Patient was admitted 9/22 for STEMI and was taken emergently for cath. Underwent PCI with DES to ostium of LAD. Also found to have moderately severe stenosis of LCX and 1st OM bifurcation and left dominant circumflex with an area of ulceration just proximal to the lesion. EF found to be 25-30%. Wcho also showed mild LVH and G1DD. Plan to address residual CAD of LCX as outpatient with stress test or FFR analysis. who presents today for cardiology evaluation.     Today, he reports he is overall doing well. He has been taking it easy. Needs note to go back to work. Per MD okay to return after 2 weeks. Patient denies chest pain os sob. He has not needed nitro. No bleeding issues on brilinta and aspirin. He worse te life vest for a week and took it off. Said he won't wear it because it's too uncomfortable. He is aware he is at high risk for lethal arrhythmia. BP today is good. Cleda Daub not started at discharge, will start. He is trying to decrease amount of smoking, currently at 2-3 cigarettes daily. He uses the gum which seems to help. The nicotine patch falls off. He does not have a PCP to follow diabetes. He denies symptoms of palpitations, chest pain, shortness of breath, orthopnea, PND, lower extremity edema, claudication, dizziness, presyncope, syncope, bleeding, or neurologic sequela. The patient is tolerating medications without difficulties and is otherwise without complaint today.   _____________   Past Medical History:  Diagnosis Date  . At risk for sudden  cardiac death, lifevest placed 03/03/2020  . CAD S/P percutaneous coronary angioplasty   . Cardiomyopathy (HCC) (ischemic)  2020-03-03  . Depression   . HLD (hyperlipidemia)   . MI (myocardial infarction) (HCC)   . Osteoarthritis   . Pre-diabetes 03/03/2020  . S/P angioplasty with stent, DES to ostium of LAD 02/23/20 Mar 03, 2020  . S/P drug eluting coronary stent placement 2013, 2021  . Tobacco abuse 03-03-2020  . Tobacco dependence due to cigarettes    Past Surgical History:  Procedure Laterality Date  . CORONARY/GRAFT ACUTE MI REVASCULARIZATION N/A 02/23/2020   Procedure: Coronary/Graft Acute MI Revascularization;  Surgeon: Tonny Bollman, MD;  Location: [facility] INVASIVE CV LAB;  Service: Cardiovascular;  Laterality: N/A;  . INTRAVASCULAR PRESSURE WIRE/FFR STUDY N/A 12/16/2017   Procedure: INTRAVASCULAR PRESSURE WIRE/FFR STUDY;  Surgeon: Swaziland, Peter M, MD;  Location: [facility] INVASIVE CV LAB;  Service: Cardiovascular;  Laterality: N/A;  . LEFT HEART CATH AND CORONARY ANGIOGRAPHY N/A 12/16/2017   Procedure: LEFT HEART CATH AND CORONARY ANGIOGRAPHY;  Surgeon: Swaziland, Peter M, MD;  Location: [facility] INVASIVE CV LAB;  Service: Cardiovascular;  Laterality: N/A;  . LEFT HEART CATH AND CORONARY ANGIOGRAPHY  02/23/2020  . LEFT HEART CATH AND CORONARY ANGIOGRAPHY N/A 02/23/2020   Procedure: LEFT HEART CATH AND CORONARY ANGIOGRAPHY;  Surgeon: Tonny Bollman, MD;  Location: [facility] INVASIVE CV LAB;  Service: Cardiovascular;  Laterality: N/A;   _____________  Current Outpatient Medications  Medication Sig Dispense Refill  . acetaminophen (TYLENOL) 325  MG tablet Take 2 tablets (650 mg total) by mouth every 4 (four) hours as needed for headache or mild pain.    Marland Kitchen aspirin EC 81 MG tablet Take 81 mg by mouth daily.    Marland Kitchen atorvastatin (LIPITOR) 80 MG tablet Take 1 tablet (80 mg total) by mouth daily. 30 tablet 6  . carvedilol (COREG) 6.25 MG tablet Take 1 tablet (6.25 mg total) by mouth 2 (two) times daily with a meal. 60  tablet 6  . dapagliflozin propanediol (FARXIGA) 10 MG TABS tablet Take 1 tablet (10 mg total) by mouth daily. 30 tablet 6  . DULoxetine (CYMBALTA) 60 MG capsule Take 60 mg by mouth 2 (two) times daily.    Marland Kitchen HYDROcodone-acetaminophen (NORCO) 7.5-325 MG tablet Take 1 tablet by mouth 4 (four) times daily as needed for pain.    Marland Kitchen losartan (COZAAR) 25 MG tablet Take 1 tablet (25 mg total) by mouth daily. 30 tablet 6  . nitroGLYCERIN (NITROSTAT) 0.4 MG SL tablet Place 1 tablet (0.4 mg total) under the tongue every 5 (five) minutes x 3 doses as needed for chest pain. 26 tablet 4  . pantoprazole (PROTONIX) 40 MG tablet Take 1 tablet (40 mg total) by mouth daily. 30 tablet 6  . QUEtiapine (SEROQUEL) 25 MG tablet Take 50 mg by mouth at bedtime.    . ticagrelor (BRILINTA) 90 MG TABS tablet Take 1 tablet (90 mg total) by mouth 2 (two) times daily. 60 tablet 11  . spironolactone (ALDACTONE) 25 MG tablet Take 0.5 tablets (12.5 mg total) by mouth daily. 45 tablet 3   No current facility-administered medications for this visit.   _____________   Allergies:   Patient has no known allergies.  _____________   Social History:  The patient  reports that he has been smoking cigarettes. He has a 38.00 pack-year smoking history. He has never used smokeless tobacco. He reports previous alcohol use. He reports that he does not use drugs.  _____________   Family History:  The patient's family history is not on file.  _____________   ROS:  Please see the history of present illness.   All other systems are reviewed and negative.  _____________   PHYSICAL EXAM: VS:  BP 122/60   Pulse 63   Ht 5\' 9"  (1.753 m)   Wt 205 lb (93 kg)   SpO2 96%   BMI 30.27 kg/m  , BMI Body mass index is 30.27 kg/m. GEN: Well nourished, well developed, in no acute distress  HEENT: normal  Neck: no JVD, carotid bruits, or masses Cardiac: RRR; no murmurs, rubs, or gallops. No clubbing, cyanosis, edema.  Radials/DP/PT 2+ and equal  bilaterally.  Respiratory:  clear to auscultation bilaterally, normal work of breathing GI: soft, nontender, nondistended, + BS MS: no deformity or atrophy  Skin: warm and dry, no rash Neuro:  Strength and sensation are intact Psych: euthymic mood, full affect _____________  EKG:   The ekg ordered today shows NSR, 67bpm, anterolateral TWI  Recent Labs: 02/24/2020: ALT 73; B Natriuretic Peptide 92.4; Hemoglobin 13.8; Platelets 252 02/25/2020: BUN 9; Creatinine, Ser 1.13; Magnesium 2.0; Potassium 3.8; Sodium 141  02/24/2020: Cholesterol 208; HDL 27; LDL Cholesterol 137; Total CHOL/HDL Ratio 7.7; Triglycerides 219; VLDL 44  Estimated Creatinine Clearance: 82.2 mL/min (by C-G formula based on SCr of 1.13 mg/dL).  Wt Readings from Last 3 Encounters:  03/08/20 205 lb (93 kg)  02/26/20 205 lb 14.4 oz (93.4 kg)  12/17/17 202 lb 3.2 oz (91.7 kg)  Diagnostic Studies/Procedures   cardiac cath 02/23/20  1. Anterior STEMI secondary to subtotal occlusion at the ostium of the LAD, treated successfully with primary PCI using a 3.5 x 12 mm resolute Onyx DES postdilated with a 4 mm balloon and evaluated with intravascular ultrasound following deployment 2. Moderately severe stenosis at the left circumflex/first obtuse marginal bifurcation and a left dominant circumflex with an area of ulceration just proximal to the lesion 3. Patent codominant right coronary artery without significant stenosis 4. Mildly elevated LVEDP  Recommendations: Aggressive medical therapy, dual antiplatelet therapy with aspirin and ticagrelor probably long-term but a minimum of 12 months without interruption, consider staged PCI of the left circumflex bifurcation, will review with colleagues and discuss appropriate timing.  Coronary Diagrams  Diagnostic Dominance: Left  Intervention      Echo 02/24/20 IMPRESSIONS    1. Definity contrast was used. There is no apical thrombus present but  the presence of a  focal area of akinesis / dyskinesis places him at risk  for apical thrombi formation. . Left ventricular ejection fraction, by  estimation, is 25 to 30%. The left  ventricle has severely decreased function. The left ventricle demonstrates  regional wall motion abnormalities (see scoring diagram/findings for  description). There is mild left ventricular hypertrophy. Left ventricular  diastolic parameters are consistent  with Grade I diastolic dysfunction (impaired relaxation). There is severe  akinesis of the left ventricular, mid-apical anteroseptal wall and apical  segment.  2. Right ventricular systolic function is normal. The right ventricular  size is normal.  3. The mitral valve is grossly normal. No evidence of mitral valve  regurgitation. No evidence of mitral stenosis.  4. The aortic valve is grossly normal. There is mild calcification of the  aortic valve. Aortic valve regurgitation is not visualized. No aortic  stenosis is present.   FINDINGS  Left Ventricle: Definity contrast was used. There is no apical thrombus  present but the presence of a focal area of akinesis / dyskinesis places  him at risk for apical thrombi formation. Left ventricular ejection  fraction, by estimation, is 25 to 30%.  The left ventricle has severely decreased function. The left ventricle  demonstrates regional wall motion abnormalities. Severe akinesis of the  left ventricular, mid-apical anteroseptal wall and apical segment.  Definity contrast agent was given IV to  delineate the left ventricular endocardial borders. The left ventricular  internal cavity size was normal in size. There is mild left ventricular  hypertrophy. Left ventricular diastolic parameters are consistent with  Grade I diastolic dysfunction  (impaired relaxation).   Right Ventricle: The right ventricular size is normal. No increase in  right ventricular wall thickness. Right ventricular systolic function is  normal.    Left Atrium: Left atrial size was normal in size.   Right Atrium: Right atrial size was normal in size.   Pericardium: There is no evidence of pericardial effusion.   Mitral Valve: The mitral valve is grossly normal. No evidence of mitral  valve regurgitation. No evidence of mitral valve stenosis.   Tricuspid Valve: The tricuspid valve is grossly normal. Tricuspid valve  regurgitation is not demonstrated. No evidence of tricuspid stenosis.   Aortic Valve: The aortic valve is grossly normal. There is mild  calcification of the aortic valve. Aortic valve regurgitation is not  visualized. No aortic stenosis is present.   Pulmonic Valve: The pulmonic valve was normal in structure. Pulmonic valve  regurgitation is not visualized. No evidence of pulmonic stenosis.   Aorta:  The aortic root and ascending aorta are structurally normal, with  no evidence of dilitation.   IAS/Shunts: The atrial septum is grossly normal.  _____________  _____________   ASSESSMENT AND PLAN:  CAD s/p prior stenting and recent anterior STEMI - recently underwent successful PCI with intravascular US guidance PCI/DESx1 to the ostium of th LAD. Has residual CAD with complex lesion at the bifurcation of the Lcx/1st OM felt to be moderate but not critical. Plan to treat medically.  - Patient has been taking it easy since discharge. No CP or SOB. Cath site stable. Okay to go back to work after 2 weeks. Will provide work note. - continue DAPT with ASA/Brilinta. Denies bleeding issues - continue coreg, statin - In regards to disease at the left circumflex bifurcation will discuss with Dr. Excell Seltzer the possibility of cath vs stress test.  Risks and benefits of cardiac catheterization have been discussed with the patient.  These include bleeding, infection, kidney damage, stroke, heart attack, death.  The patient understands these risks and is willing to proceed.  ICM - EF noted to be 25-30% with sever akinesis of th  left ventricular, mid-apical anteroseptal wall and apical segment. No apical thrombus noted. Started on coreg and losartan. Cleda Daub suggested.  - BP is stable. Start spiro 12.5mg  daily. Check BMET today,  at week 1 and week 2.  - continue coreg - no signs of volume overload - Can consider Entresto in the future if labs okay and BP can tolerate - Will need repeat echo but will wait to order until we know if we are going to cath the patient. - Patient is refusing to wear his Life vest. He wore it for a week and then took it off because it was uncomfortable. He says he understands he is at high risk for lethal arrhythmia/cardiac arrest and is willing to take the risk. Also recommended he not drive or operate heavy machinery, however patient has been driving, and again understands the risk of cardiac arrest/lethal arrythmia. Will Technical sales engineer to see how to get it sent back.   HTN - BP today 122/60.  - continue coreg and losartan - will start spiro as above - recommended daily Bps to have for next visit.   Mixed HLD - LDL 156 - continue atorvastatin  DM2 - newly diagnosed with A1C 6.5 - farxiga started at discharge - He does not have a PCP, will refer  Tobacco abuse - cessation discussed, patient is trying to quit   Disposition:   FU with APP in 3 weeks    Signed, Veleda Mun David Stall, PA-C 03/08/2020 12:11 PM    _____________ [facility] [address] [place] [address]  [phone] (office) [phone] (fax)

## 2020-03-08 ENCOUNTER — Other Ambulatory Visit: Payer: Self-pay

## 2020-03-08 ENCOUNTER — Ambulatory Visit: Payer: BC Managed Care – PPO | Admitting: Medical

## 2020-03-08 ENCOUNTER — Encounter: Payer: Self-pay | Admitting: Physician Assistant

## 2020-03-08 ENCOUNTER — Telehealth: Payer: Self-pay

## 2020-03-08 VITALS — BP 122/60 | HR 63 | Ht 69.0 in | Wt 205.0 lb

## 2020-03-08 DIAGNOSIS — E782 Mixed hyperlipidemia: Secondary | ICD-10-CM

## 2020-03-08 DIAGNOSIS — I10 Essential (primary) hypertension: Secondary | ICD-10-CM

## 2020-03-08 DIAGNOSIS — I25119 Atherosclerotic heart disease of native coronary artery with unspecified angina pectoris: Secondary | ICD-10-CM

## 2020-03-08 DIAGNOSIS — Z720 Tobacco use: Secondary | ICD-10-CM

## 2020-03-08 DIAGNOSIS — I255 Ischemic cardiomyopathy: Secondary | ICD-10-CM

## 2020-03-08 DIAGNOSIS — I213 ST elevation (STEMI) myocardial infarction of unspecified site: Secondary | ICD-10-CM

## 2020-03-08 LAB — BASIC METABOLIC PANEL
BUN/Creatinine Ratio: 9 (ref 9–20)
BUN: 13 mg/dL (ref 6–24)
CO2: 23 mmol/L (ref 20–29)
Calcium: 9.3 mg/dL (ref 8.7–10.2)
Chloride: 104 mmol/L (ref 96–106)
Creatinine, Ser: 1.37 mg/dL — ABNORMAL HIGH (ref 0.76–1.27)
GFR calc Af Amer: 66 mL/min/{1.73_m2} (ref >59)
GFR calc non Af Amer: 57 mL/min/{1.73_m2} — ABNORMAL LOW (ref >59)
Glucose: 89 mg/dL (ref 65–99)
Potassium: 4.5 mmol/L (ref 3.5–5.2)
Sodium: 140 mmol/L (ref 134–144)

## 2020-03-08 MED ORDER — SPIRONOLACTONE 25 MG PO TABS: 12.5000 mg | ORAL_TABLET | Freq: Every day | ORAL | 3 refills | Status: DC

## 2020-03-08 NOTE — Telephone Encounter (Signed)
Per Dr. Excell Seltzer, arranged 3 month echo 06/07/20 and office visit 06/09/2020. The patient was grateful for call and agrees with treatment plan.

## 2020-03-08 NOTE — Patient Instructions (Addendum)
Medication Instructions:  Your physician has recommended you make the following change in your medication:   1) Start Spironolactone 25 mg, 12.5 mg (0.5 tablet) by mouth once a day  *If you need a refill on your cardiac medications before your next appointment, please call your pharmacy*  Lab Work: You will have labs drawn today: BMET  Your physician recommends that you return for lab work in 1 week on 03/15/20 **The lab is open from 7:30AM-4:30PM** You may come anytime between these hours  Testing/Procedures: None ordered today  Follow-Up: On 03/31/20 at 8:45AM with Tereso Newcomer, PA-C  Other Instructions Check your blood pressure daily and keep a log, bring them to your next visit

## 2020-03-09 ENCOUNTER — Other Ambulatory Visit: Payer: Self-pay

## 2020-03-09 DIAGNOSIS — N289 Disorder of kidney and ureter, unspecified: Secondary | ICD-10-CM

## 2020-03-10 ENCOUNTER — Other Ambulatory Visit: Payer: Self-pay

## 2020-03-10 ENCOUNTER — Other Ambulatory Visit: Payer: BC Managed Care – PPO | Admitting: *Deleted

## 2020-03-10 DIAGNOSIS — N289 Disorder of kidney and ureter, unspecified: Secondary | ICD-10-CM | POA: Diagnosis not present

## 2020-03-11 LAB — BASIC METABOLIC PANEL
BUN/Creatinine Ratio: 10 (ref 9–20)
BUN: 13 mg/dL (ref 6–24)
CO2: 25 mmol/L (ref 20–29)
Calcium: 9.2 mg/dL (ref 8.7–10.2)
Chloride: 102 mmol/L (ref 96–106)
Creatinine, Ser: 1.33 mg/dL — ABNORMAL HIGH (ref 0.76–1.27)
GFR calc Af Amer: 69 mL/min/{1.73_m2} (ref >59)
GFR calc non Af Amer: 59 mL/min/{1.73_m2} — ABNORMAL LOW (ref >59)
Glucose: 137 mg/dL — ABNORMAL HIGH (ref 65–99)
Potassium: 3.9 mmol/L (ref 3.5–5.2)
Sodium: 140 mmol/L (ref 134–144)

## 2020-03-14 ENCOUNTER — Other Ambulatory Visit: Payer: BC Managed Care – PPO

## 2020-03-15 ENCOUNTER — Other Ambulatory Visit: Payer: BC Managed Care – PPO

## 2020-03-20 ENCOUNTER — Other Ambulatory Visit: Payer: BC Managed Care – PPO

## 2020-03-30 DIAGNOSIS — G894 Chronic pain syndrome: Secondary | ICD-10-CM | POA: Diagnosis not present

## 2020-03-30 DIAGNOSIS — Z79891 Long term (current) use of opiate analgesic: Secondary | ICD-10-CM | POA: Diagnosis not present

## 2020-03-30 DIAGNOSIS — M450 Ankylosing spondylitis of multiple sites in spine: Secondary | ICD-10-CM | POA: Diagnosis not present

## 2020-03-30 NOTE — Progress Notes (Signed)
Cardiology Office Note:    Date:  03/31/2020   ID:  Adam Kerr, DOB 19-Jul-1960, MRN [id number]  PCP:  Patient, No Pcp Per  [facility] Cardiologist:  Tonny Bollman, MD  [facility] Electrophysiologist:  None   Referring MD: No ref. provider found   Chief Complaint:  Follow-up (CAD, CHF)    Patient Profile:    Adam Kerr is a 59 y.o. male with:   Coronary artery disease   S/p NSTEMI in 2013 >> PCI to LAD  S/p anterior STEMI 9/21 >> PCI: DES to oLAD  Residual CAD: mod severe dz of LCx/OM bifurcation  Heart failure with reduced ejection fraction   Ischemic cardiomyopathy  Echocardiogram 9/21: EF 25-30  Pt DC'd from [facility] in 9/21 w LifeVest >> Pt self DC'd  Hyperlipidemia   Tobacco abuse  Diabetes mellitus    Prior CV studies: Echocardiogram 02/24/20 EF 25-30, mild LVH, Gr 1 DD, ant-sept and apical AK, normal RVSF  Cardiac catheterization 02/23/20 LM normal LAD ost 99 thrombotic, pLAD stent patent; D1 ost 95 LCx prox 70 (ulceration prox to lesion); OM1 50 RCA normal PCI: 3.5 x 12 mm Resolute Onyx DES to oLAD  History of Present Illness:    Adam Kerr was last seen for post hospital follow up 03/08/20.  He had self DC'd his LifeVest and did not want to continue wearing it despite the increased risk of SCD.  His labs demonstrated increased Creatinine and Spironolactone was not started.  A repeat BMET demonstrated stable Creatinine.  He has a follow up echocardiogram scheduled in Jan 2022 and follow up with Dr. Excell Seltzer afterward.  He returns today for follow up.  He is here alone.  Since last seen, he has done well.  He has not had chest discomfort, shortness of breath, orthopnea, leg swelling or syncope.  He did notice significant lightheadedness with standing at work.  He changed his losartan to every other day and those symptoms resolved.      Past Medical History:  Diagnosis Date  . At risk for sudden cardiac death, lifevest placed 2020-03-24    . CAD S/P percutaneous coronary angioplasty   . Cardiomyopathy (HCC) (ischemic)  03-24-2020  . Depression   . HLD (hyperlipidemia)   . MI (myocardial infarction) (HCC)   . Osteoarthritis   . Pre-diabetes 03-24-2020  . S/P angioplasty with stent, DES to ostium of LAD 02/23/20 2020/03/24  . S/P drug eluting coronary stent placement 2013, 2021  . Tobacco abuse 03-24-20  . Tobacco dependence due to cigarettes     Current Medications: Current Meds  Medication Sig  . acetaminophen (TYLENOL) 325 MG tablet Take 2 tablets (650 mg total) by mouth every 4 (four) hours as needed for headache or mild pain.  Marland Kitchen aspirin EC 81 MG tablet Take 81 mg by mouth daily.  Marland Kitchen atorvastatin (LIPITOR) 80 MG tablet Take 1 tablet (80 mg total) by mouth daily.  . carvedilol (COREG) 6.25 MG tablet Take 1 tablet (6.25 mg total) by mouth 2 (two) times daily with a meal.  . dapagliflozin propanediol (FARXIGA) 10 MG TABS tablet Take 1 tablet (10 mg total) by mouth daily.  . DULoxetine (CYMBALTA) 60 MG capsule Take 60 mg by mouth 2 (two) times daily.  Marland Kitchen HYDROcodone-acetaminophen (NORCO) 7.5-325 MG tablet Take 1 tablet by mouth 4 (four) times daily as needed for pain.  Marland Kitchen losartan (COZAAR) 25 MG tablet Take 25 mg by mouth every other day.  . nitroGLYCERIN (NITROSTAT) 0.4 MG SL  tablet Place 1 tablet (0.4 mg total) under the tongue every 5 (five) minutes x 3 doses as needed for chest pain.  . pantoprazole (PROTONIX) 40 MG tablet Take 1 tablet (40 mg total) by mouth daily.  . QUEtiapine (SEROQUEL) 100 MG tablet Take 50 mg by mouth at bedtime.  . ticagrelor (BRILINTA) 90 MG TABS tablet Take 1 tablet (90 mg total) by mouth 2 (two) times daily.     Allergies:   Patient has no known allergies.   Social History   Tobacco Use  . Smoking status: Current Every Day Smoker    Packs/day: 1.00    Years: 38.00    Pack years: 38.00    Types: Cigarettes  . Smokeless tobacco: Never Used  . Tobacco comment: Pt chews 10 pieces of  Nicotine gum  Vaping Use  . Vaping Use: Never used  Substance Use Topics  . Alcohol use: Not Currently  . Drug use: Never     Family Hx: The patient's family history is not on file.  Review of Systems  Gastrointestinal: Negative for hematochezia and melena.     EKGs/Labs/Other Test Reviewed:    EKG:  EKG is not ordered today.  The ekg ordered today demonstrates n/a  Recent Labs: 02/24/2020: ALT 73; B Natriuretic Peptide 92.4; Hemoglobin 13.8; Platelets 252 02/25/2020: Magnesium 2.0 03/10/2020: BUN 13; Creatinine, Ser 1.33; Potassium 3.9; Sodium 140   Recent Lipid Panel Lab Results  Component Value Date/Time   CHOL 208 (H) 02/24/2020 01:44 AM   TRIG 219 (H) 02/24/2020 01:44 AM   HDL 27 (L) 02/24/2020 01:44 AM   CHOLHDL 7.7 02/24/2020 01:44 AM   LDLCALC 137 (H) 02/24/2020 01:44 AM      Risk Assessment/Calculations:      Physical Exam:    VS:  BP 118/70   Pulse 68   Ht 5\' 9"  (1.753 m)   Wt 199 lb (90.3 kg)   SpO2 97%   BMI 29.39 kg/m     Wt Readings from Last 3 Encounters:  03/31/20 199 lb (90.3 kg)  03/08/20 205 lb (93 kg)  02/26/20 205 lb 14.4 oz (93.4 kg)     Constitutional:      Appearance: Healthy appearance. Not in distress.  Neck:     Vascular: JVD normal.  Pulmonary:     Effort: Pulmonary effort is normal.     Breath sounds: No wheezing. No rales.  Cardiovascular:     Normal rate. Occasional ectopic beats. Regular rhythm. Normal S1. Normal S2.     Murmurs: There is no murmur.  Edema:    Peripheral edema absent.  Abdominal:     Palpations: Abdomen is soft.  Skin:    General: Skin is warm and dry.  Neurological:     General: No focal deficit present.     Mental Status: Alert and oriented to person, place and time.     Cranial Nerves: Cranial nerves are intact.      ASSESSMENT & PLAN:    1. Coronary artery disease involving native coronary artery of native heart without angina pectoris Prior history of non-STEMI in 2013 treated with PCI  to the LAD and recent anterior STEMI in September 2021 treated with DES to the LAD.  He does have residual disease in the LCx and OM which has been managed medically.  He is currently doing well without anginal symptoms.  He prefers to exercise on his own instead of going to cardiac rehabilitation.  We discussed the importance of dual  antiplatelet therapy for minimum of 12 months post ACS.  Continue current dose of aspirin, atorvastatin, carvedilol, ticagrelor.  Follow-up with Dr. Excell Seltzer in January as planned.  2. HFrEF (heart failure with reduced ejection fraction) (HCC) Patient was discharged on LifeVest but self discontinued this.  Ischemic cardiomyopathy.  NYHA II.  Volume status stable.  He has had symptomatic low blood pressures and reduced his losartan to every other day.  I have asked him to try to take losartan 12.5 mg daily.  We held off on starting spironolactone at last visit due to worsening kidney function.  Continue current dose of carvedilol, dapagliflozin.  If his pressure increases over time, we may be able to reconsider spironolactone or switching ARB to Entresto.  Follow-up echocardiogram is scheduled for January to reassess LV function.  If EF remains <35, he will need referral to EP for consideration of ICD.  3. AKI (acute kidney injury) (HCC) Creatinine increased posthospitalization.  We held off on starting spironolactone due to this.  Obtain follow-up BMET today.  4. Mixed hyperlipidemia Continue high intensity statin therapy.  Arrange fasting CMET, lipids.  5. Type 2 diabetes mellitus with complication (HCC) Continue Dapagliflozin.  Follow-up with primary care  6. Tobacco abuse We discussed the importance of quitting.    Dispo:  Return in 10 weeks (on 06/09/2020) for Scheduled Follow Up, w/ Dr. Excell Seltzer, in person.   Medication Adjustments/Labs and Tests Ordered: Current medicines are reviewed at length with the patient today.  Concerns regarding medicines are outlined  above.  Tests Ordered: Orders Placed This Encounter  Procedures  . Lipid panel  . Basic metabolic panel  . Comprehensive metabolic panel   Medication Changes: No orders of the defined types were placed in this encounter.   Signed, Tereso Newcomer, PA-C  03/31/2020 9:21 AM    [facility] [address] [place], [place], [place]  [address] Phone: [phone]; Fax: [phone]

## 2020-03-31 ENCOUNTER — Encounter: Payer: Self-pay | Admitting: Physician Assistant

## 2020-03-31 ENCOUNTER — Ambulatory Visit: Payer: BC Managed Care – PPO | Admitting: Physician Assistant

## 2020-03-31 ENCOUNTER — Other Ambulatory Visit: Payer: Self-pay

## 2020-03-31 VITALS — BP 118/70 | HR 68 | Ht 69.0 in | Wt 199.0 lb

## 2020-03-31 DIAGNOSIS — I2510 Atherosclerotic heart disease of native coronary artery without angina pectoris: Secondary | ICD-10-CM | POA: Diagnosis not present

## 2020-03-31 DIAGNOSIS — I5020 Unspecified systolic (congestive) heart failure: Secondary | ICD-10-CM

## 2020-03-31 DIAGNOSIS — N179 Acute kidney failure, unspecified: Secondary | ICD-10-CM | POA: Diagnosis not present

## 2020-03-31 DIAGNOSIS — Z720 Tobacco use: Secondary | ICD-10-CM

## 2020-03-31 DIAGNOSIS — E118 Type 2 diabetes mellitus with unspecified complications: Secondary | ICD-10-CM

## 2020-03-31 DIAGNOSIS — E782 Mixed hyperlipidemia: Secondary | ICD-10-CM

## 2020-03-31 LAB — BASIC METABOLIC PANEL
BUN/Creatinine Ratio: 11 (ref 9–20)
BUN: 12 mg/dL (ref 6–24)
CO2: 21 mmol/L (ref 20–29)
Calcium: 9 mg/dL (ref 8.7–10.2)
Chloride: 105 mmol/L (ref 96–106)
Creatinine, Ser: 1.07 mg/dL (ref 0.76–1.27)
GFR calc Af Amer: 89 mL/min/{1.73_m2} (ref >59)
GFR calc non Af Amer: 77 mL/min/{1.73_m2} (ref >59)
Glucose: 104 mg/dL — ABNORMAL HIGH (ref 65–99)
Potassium: 4.3 mmol/L (ref 3.5–5.2)
Sodium: 137 mmol/L (ref 134–144)

## 2020-03-31 NOTE — Patient Instructions (Signed)
Medication Instructions:  Your physician recommends that you continue on your current medications as directed. Please refer to the Current Medication list given to you today.  *If you need a refill on your cardiac medications before your next appointment, please call your pharmacy*  Lab Work: You will have labs drawn today: BMET  Your physician recommends that you return for lab work in 2 weeks on **The lab is open from 7:30AM-4:30PM** You many come anytime between these hours, make sure you are fasting your cholesterol will be checked.   Testing/Procedures: None ordered today  Follow-Up: Keep follow up in January for echocardiogram and appointment with Dr. Excell Seltzer.  Other Instructions Try taking your Losartan 0.5 tablet by mouth once a day. Call the office if your systolic blood pressure (top number) is 130 or higher consistently.

## 2020-04-14 ENCOUNTER — Other Ambulatory Visit: Payer: Self-pay

## 2020-04-14 ENCOUNTER — Encounter: Payer: Self-pay | Admitting: Cardiovascular Disease

## 2020-04-14 ENCOUNTER — Other Ambulatory Visit: Payer: BC Managed Care – PPO | Admitting: *Deleted

## 2020-04-14 DIAGNOSIS — I5020 Unspecified systolic (congestive) heart failure: Secondary | ICD-10-CM | POA: Diagnosis not present

## 2020-04-14 DIAGNOSIS — E782 Mixed hyperlipidemia: Secondary | ICD-10-CM

## 2020-04-14 DIAGNOSIS — E118 Type 2 diabetes mellitus with unspecified complications: Secondary | ICD-10-CM

## 2020-04-14 DIAGNOSIS — I2510 Atherosclerotic heart disease of native coronary artery without angina pectoris: Secondary | ICD-10-CM | POA: Diagnosis not present

## 2020-04-14 DIAGNOSIS — N179 Acute kidney failure, unspecified: Secondary | ICD-10-CM | POA: Diagnosis not present

## 2020-04-14 LAB — COMPREHENSIVE METABOLIC PANEL
ALT: 21 IU/L (ref 0–44)
AST: 20 IU/L (ref 0–40)
Albumin/Globulin Ratio: 1.8 (ref 1.2–2.2)
Albumin: 4.4 g/dL (ref 3.8–4.9)
Alkaline Phosphatase: 96 IU/L (ref 44–121)
BUN/Creatinine Ratio: 10 (ref 9–20)
BUN: 12 mg/dL (ref 6–24)
Bilirubin Total: 0.4 mg/dL (ref 0.0–1.2)
CO2: 23 mmol/L (ref 20–29)
Calcium: 9.1 mg/dL (ref 8.7–10.2)
Chloride: 103 mmol/L (ref 96–106)
Creatinine, Ser: 1.18 mg/dL (ref 0.76–1.27)
GFR calc Af Amer: 79 mL/min/{1.73_m2} (ref >59)
GFR calc non Af Amer: 68 mL/min/{1.73_m2} (ref >59)
Globulin, Total: 2.5 g/dL (ref 1.5–4.5)
Glucose: 99 mg/dL (ref 65–99)
Potassium: 4.5 mmol/L (ref 3.5–5.2)
Sodium: 138 mmol/L (ref 134–144)
Total Protein: 6.9 g/dL (ref 6.0–8.5)

## 2020-04-14 LAB — LIPID PANEL
Chol/HDL Ratio: 4.1 ratio (ref 0.0–5.0)
Cholesterol, Total: 111 mg/dL (ref 100–199)
HDL: 27 mg/dL — ABNORMAL LOW (ref >39)
LDL Chol Calc (NIH): 67 mg/dL (ref 0–99)
Triglycerides: 88 mg/dL (ref 0–149)
VLDL Cholesterol Cal: 17 mg/dL (ref 5–40)

## 2020-05-01 DIAGNOSIS — Z79891 Long term (current) use of opiate analgesic: Secondary | ICD-10-CM | POA: Diagnosis not present

## 2020-05-01 DIAGNOSIS — G894 Chronic pain syndrome: Secondary | ICD-10-CM | POA: Diagnosis not present

## 2020-05-01 DIAGNOSIS — M450 Ankylosing spondylitis of multiple sites in spine: Secondary | ICD-10-CM | POA: Diagnosis not present

## 2020-05-31 DIAGNOSIS — M450 Ankylosing spondylitis of multiple sites in spine: Secondary | ICD-10-CM | POA: Diagnosis not present

## 2020-05-31 DIAGNOSIS — Z6828 Body mass index (BMI) 28.0-28.9, adult: Secondary | ICD-10-CM | POA: Diagnosis not present

## 2020-05-31 DIAGNOSIS — G894 Chronic pain syndrome: Secondary | ICD-10-CM | POA: Diagnosis not present

## 2020-06-07 ENCOUNTER — Encounter (HOSPITAL_COMMUNITY): Payer: Self-pay

## 2020-06-07 ENCOUNTER — Ambulatory Visit (HOSPITAL_COMMUNITY): Payer: BC Managed Care – PPO | Attending: Cardiology

## 2020-06-07 ENCOUNTER — Other Ambulatory Visit: Payer: Self-pay

## 2020-06-07 DIAGNOSIS — I213 ST elevation (STEMI) myocardial infarction of unspecified site: Secondary | ICD-10-CM | POA: Insufficient documentation

## 2020-06-07 LAB — ECHOCARDIOGRAM COMPLETE
Area-P 1/2: 2.73 cm2
S' Lateral: 3.5 cm

## 2020-06-09 ENCOUNTER — Other Ambulatory Visit: Payer: Self-pay

## 2020-06-09 ENCOUNTER — Ambulatory Visit: Payer: BC Managed Care – PPO | Admitting: Cardiovascular Disease

## 2020-06-09 ENCOUNTER — Encounter: Payer: Self-pay | Admitting: Cardiovascular Disease

## 2020-06-09 VITALS — BP 112/66 | HR 64 | Ht 67.0 in | Wt 195.8 lb

## 2020-06-09 DIAGNOSIS — I5022 Chronic systolic (congestive) heart failure: Secondary | ICD-10-CM | POA: Diagnosis not present

## 2020-06-09 DIAGNOSIS — E782 Mixed hyperlipidemia: Secondary | ICD-10-CM

## 2020-06-09 DIAGNOSIS — I2510 Atherosclerotic heart disease of native coronary artery without angina pectoris: Secondary | ICD-10-CM | POA: Diagnosis not present

## 2020-06-09 DIAGNOSIS — Z720 Tobacco use: Secondary | ICD-10-CM | POA: Diagnosis not present

## 2020-06-09 NOTE — Patient Instructions (Signed)
Medication Instructions:  Your provider recommends that you continue on your current medications as directed. Please refer to the Current Medication list given to you today.   *If you need a refill on your cardiac medications before your next appointment, please call your pharmacy*  Follow-Up: At [facility], you and your health needs are our priority.  As part of our continuing mission to provide you with exceptional heart care, we have created designated Provider Care Teams.  These Care Teams include your primary Cardiologist (physician) and Advanced Practice Providers (APPs -  Physician Assistants and Nurse Practitioners) who all work together to provide you with the care you need, when you need it. Your next appointment:   4 month(s) The format for your next appointment:   In Person Provider:   Scott Weaver, PA-C  

## 2020-06-09 NOTE — Progress Notes (Signed)
Cardiology Office Note:    Date:  06/09/2020   ID:  Adam Kerr, DOB 1960-12-23, MRN [id number]  PCP:  Patient, No Pcp Per  [facility] Cardiologist:  Tonny Bollman, MD  [facility] Electrophysiologist:  None   Referring MD: No ref. provider found   No chief complaint on file.   History of Present Illness:    Adam Kerr is a 60 y.o. male with a hx of:  Coronary artery disease  ? S/p NSTEMI in 2013 >> PCI to LAD ? S/p anterior STEMI 9/21 >> PCI: DES to oLAD  Residual CAD: mod severe dz of LCx/OM bifurcation  Heart failure with reduced ejection fraction  ? Ischemic cardiomyopathy ? Echocardiogram 9/21: EF 25-30 ? Pt DC'd from [facility] in 9/21 w LifeVest >> Pt self DC'd  Hyperlipidemia   Tobacco abuse  Diabetes mellitus   The patient is here alone today after seeing Tereso Newcomer in follow-up in October 2021.  He is feeling well and has no specific complaints today.  He denies chest pain, shortness of breath, or heart palpitations.  He does not have any limitation with respect to his work.  He is working in an [employer] physical activities without difficulty.  He is tolerating his medications well.  He had a follow-up echocardiogram just a few days ago that showed significant improvement in his LV function, now with LVEF 40 to 45% (improved from 20 to 25%).  Past Medical History:  Diagnosis Date  . At risk for sudden cardiac death, lifevest placed 02/27/2020  . CAD S/P percutaneous coronary angioplasty   . Cardiomyopathy (HCC) (ischemic)  Feb 27, 2020  . Depression   . HLD (hyperlipidemia)   . MI (myocardial infarction) (HCC)   . Osteoarthritis   . Pre-diabetes 27-Feb-2020  . S/P angioplasty with stent, DES to ostium of LAD 02/23/20 February 27, 2020  . S/P drug eluting coronary stent placement 2013, 2021  . Tobacco abuse 02-27-20  . Tobacco dependence due to cigarettes     Past Surgical History:  Procedure Laterality Date  . CORONARY/GRAFT  ACUTE MI REVASCULARIZATION N/A 02/23/2020   Procedure: Coronary/Graft Acute MI Revascularization;  Surgeon: Tonny Bollman, MD;  Location: [facility] INVASIVE CV LAB;  Service: Cardiovascular;  Laterality: N/A;  . INTRAVASCULAR PRESSURE WIRE/FFR STUDY N/A 12/16/2017   Procedure: INTRAVASCULAR PRESSURE WIRE/FFR STUDY;  Surgeon: Swaziland, Peter M, MD;  Location: [facility] INVASIVE CV LAB;  Service: Cardiovascular;  Laterality: N/A;  . LEFT HEART CATH AND CORONARY ANGIOGRAPHY N/A 12/16/2017   Procedure: LEFT HEART CATH AND CORONARY ANGIOGRAPHY;  Surgeon: Swaziland, Peter M, MD;  Location: [facility] INVASIVE CV LAB;  Service: Cardiovascular;  Laterality: N/A;  . LEFT HEART CATH AND CORONARY ANGIOGRAPHY  02/23/2020  . LEFT HEART CATH AND CORONARY ANGIOGRAPHY N/A 02/23/2020   Procedure: LEFT HEART CATH AND CORONARY ANGIOGRAPHY;  Surgeon: Tonny Bollman, MD;  Location: [facility] INVASIVE CV LAB;  Service: Cardiovascular;  Laterality: N/A;    Current Medications: Current Meds  Medication Sig  . acetaminophen (TYLENOL) 325 MG tablet Take 2 tablets (650 mg total) by mouth every 4 (four) hours as needed for headache or mild pain.  Marland Kitchen aspirin EC 81 MG tablet Take 81 mg by mouth daily.  Marland Kitchen atorvastatin (LIPITOR) 80 MG tablet Take 1 tablet (80 mg total) by mouth daily.  . carvedilol (COREG) 6.25 MG tablet Take 1 tablet (6.25 mg total) by mouth 2 (two) times daily with a meal.  . dapagliflozin propanediol (FARXIGA) 10 MG TABS tablet Take 1 tablet (  10 mg total) by mouth daily.  . DULoxetine (CYMBALTA) 60 MG capsule Take 60 mg by mouth 2 (two) times daily.  Marland Kitchen HYDROcodone Bitartrate ER 40 MG T24A   . HYDROcodone-acetaminophen (NORCO) 7.5-325 MG tablet Take 1 tablet by mouth 4 (four) times daily as needed for pain.  Marland Kitchen losartan (COZAAR) 25 MG tablet Take 25 mg by mouth every other day.  . nitroGLYCERIN (NITROSTAT) 0.4 MG SL tablet Place 1 tablet (0.4 mg total) under the tongue every 5 (five) minutes x 3 doses as needed for chest pain.  .  pantoprazole (PROTONIX) 40 MG tablet Take 1 tablet (40 mg total) by mouth daily.  . QUEtiapine (SEROQUEL) 100 MG tablet Take 50 mg by mouth at bedtime.  . ticagrelor (BRILINTA) 90 MG TABS tablet Take 1 tablet (90 mg total) by mouth 2 (two) times daily.     Allergies:   Patient has no known allergies.   Social History   Socioeconomic History  . Marital status: Single    Spouse name: Not on file  . Number of children: Not on file  . Years of education: Not on file  . Highest education level: Not on file  Occupational History  . Not on file  Tobacco Use  . Smoking status: Current Every Day Smoker    Packs/day: 1.00    Years: 38.00    Pack years: 38.00    Types: Cigarettes  . Smokeless tobacco: Never Used  . Tobacco comment: Pt chews 10 pieces of Nicotine gum  Vaping Use  . Vaping Use: Never used  Substance and Sexual Activity  . Alcohol use: Not Currently  . Drug use: Never  . Sexual activity: Yes    Partners: Female  Other Topics Concern  . Not on file  Social History Narrative  . Not on file   Social Determinants of Health   Financial Resource Strain: Not on file  Food Insecurity: Not on file  Transportation Needs: Not on file  Physical Activity: Not on file  Stress: Not on file  Social Connections: Not on file     Family History: The patient's family history is not on file.  ROS:   Please see the history of present illness.    All other systems reviewed and are negative.  EKGs/Labs/Other Studies Reviewed:    The following studies were reviewed today: Cath 02/23/2020: Conclusion  1.  Anterior STEMI secondary to subtotal occlusion at the ostium of the LAD, treated successfully with primary PCI using a 3.5 x 12 mm resolute Onyx DES postdilated with a 4 mm balloon and evaluated with intravascular ultrasound following deployment 2.  Moderately severe stenosis at the left circumflex/first obtuse marginal bifurcation and a left dominant circumflex with an area of  ulceration just proximal to the lesion 3.  Patent codominant right coronary artery without significant stenosis 4.  Mildly elevated LVEDP  Recommendations: Aggressive medical therapy, dual antiplatelet therapy with aspirin and ticagrelor probably long-term but a minimum of 12 months without interruption, consider staged PCI of the left circumflex bifurcation, will review with colleagues and discuss appropriate timing.  Echo 06/07/2020: IMPRESSIONS    1. Left ventricular ejection fraction, by estimation, is 40 to 45%. The  left ventricle has mildly decreased function. The left ventricle  demonstrates global hypokinesis. Left ventricular diastolic parameters are  consistent with Grade I diastolic  dysfunction (impaired relaxation).  2. Right ventricular systolic function is normal. The right ventricular  size is normal.  3. The mitral valve is normal  in structure. No evidence of mitral valve  regurgitation. No evidence of mitral stenosis.  4. The aortic valve is normal in structure. Aortic valve regurgitation is  trivial. No aortic stenosis is present.  5. The inferior vena cava is normal in size with greater than 50%  respiratory variability, suggesting right atrial pressure of 3 mmHg.   Comparison(s): Prior images reviewed side by side. The left ventricular  function has improved.   EKG:  EKG is not ordered today.    Recent Labs: 02/24/2020: B Natriuretic Peptide 92.4; Hemoglobin 13.8; Platelets 252 02/25/2020: Magnesium 2.0 04/14/2020: ALT 21; BUN 12; Creatinine, Ser 1.18; Potassium 4.5; Sodium 138  Recent Lipid Panel    Component Value Date/Time   CHOL 111 04/14/2020 0720   TRIG 88 04/14/2020 0720   HDL 27 (L) 04/14/2020 0720   CHOLHDL 4.1 04/14/2020 0720   CHOLHDL 7.7 02/24/2020 0144   VLDL 44 (H) 02/24/2020 0144   LDLCALC 67 04/14/2020 0720     Risk Assessment/Calculations:       Physical Exam:    VS:  BP 112/66   Pulse 64   Ht 5\' 7"  (1.702 m)   Wt 195 lb  12.8 oz (88.8 kg)   SpO2 96%   BMI 30.67 kg/m     Wt Readings from Last 3 Encounters:  06/09/20 195 lb 12.8 oz (88.8 kg)  03/31/20 199 lb (90.3 kg)  03/08/20 205 lb (93 kg)     GEN: Well nourished, well developed in no acute distress HEENT: Normal NECK: No JVD; No carotid bruits LYMPHATICS: No lymphadenopathy CARDIAC: RRR, no murmurs, rubs, gallops RESPIRATORY:  Clear to auscultation without rales, wheezing or rhonchi  ABDOMEN: Soft, non-tender, non-distended MUSCULOSKELETAL:  No edema; No deformity  SKIN: Warm and dry NEUROLOGIC:  Alert and oriented x 3 PSYCHIATRIC:  Normal affect   ASSESSMENT:    1. Coronary artery disease involving native coronary artery of native heart without angina pectoris   2. Tobacco abuse   3. Mixed hyperlipidemia   4. Chronic systolic heart failure (HCC)    PLAN:    In order of problems listed above:  1. The patient appears to be doing very well with improvement in LV function and no significant functional limitation.  He is currently without symptoms of angina or congestive heart failure.  He will remain on dual antiplatelet therapy with aspirin and ticagrelor for at least 12 months from the time of his MI in September 2021.  He is tolerating a high intensity statin drug with LDL cholesterol below 70 mg/dL.  He is tolerating low doses of losartan and carvedilol.  He is not keen on changing or increasing his medications at this time.  We will set him up for follow-up with October 2021 in 4 months and I will plan to see him back in about 8 months when he is a year out from his MI. 2. Counseling done.  He has cut back but does not think he will be able to quit smoking. 3. Lipids excellent with LDL cholesterol 67 mg/dL.  Continue atorvastatin 80 mg daily.  ALT was normal at 21. 4. Appears euvolemic on exam.  LVEF is improved by echo (see report above).  I did not make any changes in his medical regimen today.  He does not require any diuretic therapy and  he has no symptoms of heart failure at this time.  Medication Adjustments/Labs and Tests Ordered: Current medicines are reviewed at length with the patient today.  Concerns  regarding medicines are outlined above.  No orders of the defined types were placed in this encounter.  No orders of the defined types were placed in this encounter.   Patient Instructions  Medication Instructions:  Your provider recommends that you continue on your current medications as directed. Please refer to the Current Medication list given to you today.   *If you need a refill on your cardiac medications before your next appointment, please call your pharmacy*  Follow-Up: At [facility], you and your health needs are our priority.  As part of our continuing mission to provide you with exceptional heart care, we have created designated Provider Care Teams.  These Care Teams include your primary Cardiologist (physician) and Advanced Practice Providers (APPs -  Physician Assistants and Nurse Practitioners) who all work together to provide you with the care you need, when you need it. Your next appointment:   4 month(s) The format for your next appointment:   In Person Provider:   Richardson Dopp, PA-C     Signed, Sherren Mocha, MD  06/09/2020 3:45 PM    [place]

## 2020-06-28 DIAGNOSIS — Z6827 Body mass index (BMI) 27.0-27.9, adult: Secondary | ICD-10-CM | POA: Diagnosis not present

## 2020-06-28 DIAGNOSIS — M450 Ankylosing spondylitis of multiple sites in spine: Secondary | ICD-10-CM | POA: Diagnosis not present

## 2020-06-28 DIAGNOSIS — G894 Chronic pain syndrome: Secondary | ICD-10-CM | POA: Diagnosis not present

## 2020-07-27 DIAGNOSIS — G894 Chronic pain syndrome: Secondary | ICD-10-CM | POA: Diagnosis not present

## 2020-07-27 DIAGNOSIS — M450 Ankylosing spondylitis of multiple sites in spine: Secondary | ICD-10-CM | POA: Diagnosis not present

## 2020-07-27 DIAGNOSIS — Z6828 Body mass index (BMI) 28.0-28.9, adult: Secondary | ICD-10-CM | POA: Diagnosis not present

## 2020-08-29 DIAGNOSIS — I2510 Atherosclerotic heart disease of native coronary artery without angina pectoris: Secondary | ICD-10-CM | POA: Diagnosis not present

## 2020-08-29 DIAGNOSIS — Z114 Encounter for screening for human immunodeficiency virus [HIV]: Secondary | ICD-10-CM | POA: Diagnosis not present

## 2020-08-29 DIAGNOSIS — Z1159 Encounter for screening for other viral diseases: Secondary | ICD-10-CM | POA: Diagnosis not present

## 2020-08-29 DIAGNOSIS — Z79899 Other long term (current) drug therapy: Secondary | ICD-10-CM | POA: Diagnosis not present

## 2020-08-29 DIAGNOSIS — F17210 Nicotine dependence, cigarettes, uncomplicated: Secondary | ICD-10-CM | POA: Diagnosis not present

## 2020-08-29 DIAGNOSIS — Z0000 Encounter for general adult medical examination without abnormal findings: Secondary | ICD-10-CM | POA: Diagnosis not present

## 2020-08-29 DIAGNOSIS — Z13220 Encounter for screening for lipoid disorders: Secondary | ICD-10-CM | POA: Diagnosis not present

## 2020-08-29 DIAGNOSIS — I10 Essential (primary) hypertension: Secondary | ICD-10-CM | POA: Diagnosis not present

## 2020-08-29 DIAGNOSIS — Z87898 Personal history of other specified conditions: Secondary | ICD-10-CM | POA: Diagnosis not present

## 2020-08-29 DIAGNOSIS — R5383 Other fatigue: Secondary | ICD-10-CM | POA: Diagnosis not present

## 2020-09-20 DIAGNOSIS — F17210 Nicotine dependence, cigarettes, uncomplicated: Secondary | ICD-10-CM | POA: Diagnosis not present

## 2020-09-20 DIAGNOSIS — Z79899 Other long term (current) drug therapy: Secondary | ICD-10-CM | POA: Diagnosis not present

## 2020-09-20 DIAGNOSIS — R7303 Prediabetes: Secondary | ICD-10-CM | POA: Diagnosis not present

## 2020-09-20 DIAGNOSIS — I10 Essential (primary) hypertension: Secondary | ICD-10-CM | POA: Diagnosis not present

## 2020-09-20 DIAGNOSIS — R3129 Other microscopic hematuria: Secondary | ICD-10-CM | POA: Diagnosis not present

## 2020-09-20 DIAGNOSIS — E789 Disorder of lipoprotein metabolism, unspecified: Secondary | ICD-10-CM | POA: Diagnosis not present

## 2020-09-28 DIAGNOSIS — F330 Major depressive disorder, recurrent, mild: Secondary | ICD-10-CM | POA: Diagnosis not present

## 2020-09-28 DIAGNOSIS — F411 Generalized anxiety disorder: Secondary | ICD-10-CM | POA: Diagnosis not present

## 2020-09-28 DIAGNOSIS — F4320 Adjustment disorder, unspecified: Secondary | ICD-10-CM | POA: Diagnosis not present

## 2020-10-02 DIAGNOSIS — R3129 Other microscopic hematuria: Secondary | ICD-10-CM | POA: Diagnosis not present

## 2020-10-02 DIAGNOSIS — I2510 Atherosclerotic heart disease of native coronary artery without angina pectoris: Secondary | ICD-10-CM | POA: Diagnosis not present

## 2020-10-03 DIAGNOSIS — F17210 Nicotine dependence, cigarettes, uncomplicated: Secondary | ICD-10-CM | POA: Diagnosis not present

## 2020-10-03 DIAGNOSIS — R3129 Other microscopic hematuria: Secondary | ICD-10-CM | POA: Diagnosis not present

## 2020-10-03 DIAGNOSIS — I2510 Atherosclerotic heart disease of native coronary artery without angina pectoris: Secondary | ICD-10-CM | POA: Diagnosis not present

## 2020-10-03 DIAGNOSIS — E789 Disorder of lipoprotein metabolism, unspecified: Secondary | ICD-10-CM | POA: Diagnosis not present

## 2020-10-03 DIAGNOSIS — I10 Essential (primary) hypertension: Secondary | ICD-10-CM | POA: Diagnosis not present

## 2020-10-03 DIAGNOSIS — R7303 Prediabetes: Secondary | ICD-10-CM | POA: Diagnosis not present

## 2020-10-06 DIAGNOSIS — Z87891 Personal history of nicotine dependence: Secondary | ICD-10-CM | POA: Diagnosis not present

## 2020-10-09 DIAGNOSIS — I255 Ischemic cardiomyopathy: Secondary | ICD-10-CM | POA: Diagnosis not present

## 2020-10-26 DIAGNOSIS — Z79899 Other long term (current) drug therapy: Secondary | ICD-10-CM | POA: Diagnosis not present

## 2020-10-26 DIAGNOSIS — M546 Pain in thoracic spine: Secondary | ICD-10-CM | POA: Diagnosis not present

## 2020-10-26 DIAGNOSIS — M79671 Pain in right foot: Secondary | ICD-10-CM | POA: Diagnosis not present

## 2020-10-26 DIAGNOSIS — M79641 Pain in right hand: Secondary | ICD-10-CM | POA: Diagnosis not present

## 2020-10-26 DIAGNOSIS — M79642 Pain in left hand: Secondary | ICD-10-CM | POA: Diagnosis not present

## 2020-10-26 DIAGNOSIS — M542 Cervicalgia: Secondary | ICD-10-CM | POA: Diagnosis not present

## 2020-10-26 DIAGNOSIS — M545 Low back pain, unspecified: Secondary | ICD-10-CM | POA: Diagnosis not present

## 2020-10-26 DIAGNOSIS — M79672 Pain in left foot: Secondary | ICD-10-CM | POA: Diagnosis not present

## 2020-11-01 DIAGNOSIS — F330 Major depressive disorder, recurrent, mild: Secondary | ICD-10-CM | POA: Diagnosis not present

## 2020-11-01 DIAGNOSIS — F17200 Nicotine dependence, unspecified, uncomplicated: Secondary | ICD-10-CM | POA: Diagnosis not present

## 2020-11-01 DIAGNOSIS — F17210 Nicotine dependence, cigarettes, uncomplicated: Secondary | ICD-10-CM | POA: Diagnosis not present

## 2020-11-01 DIAGNOSIS — F411 Generalized anxiety disorder: Secondary | ICD-10-CM | POA: Diagnosis not present

## 2020-11-03 DIAGNOSIS — F17210 Nicotine dependence, cigarettes, uncomplicated: Secondary | ICD-10-CM | POA: Diagnosis not present

## 2020-11-03 DIAGNOSIS — I2510 Atherosclerotic heart disease of native coronary artery without angina pectoris: Secondary | ICD-10-CM | POA: Diagnosis not present

## 2020-11-03 DIAGNOSIS — I10 Essential (primary) hypertension: Secondary | ICD-10-CM | POA: Diagnosis not present

## 2020-11-03 DIAGNOSIS — R7303 Prediabetes: Secondary | ICD-10-CM | POA: Diagnosis not present

## 2020-11-03 DIAGNOSIS — E789 Disorder of lipoprotein metabolism, unspecified: Secondary | ICD-10-CM | POA: Diagnosis not present

## 2020-11-08 ENCOUNTER — Ambulatory Visit: Payer: BC Managed Care – PPO | Admitting: Physician Assistant

## 2020-11-09 DIAGNOSIS — I255 Ischemic cardiomyopathy: Secondary | ICD-10-CM | POA: Diagnosis not present

## 2020-11-27 DIAGNOSIS — M545 Low back pain, unspecified: Secondary | ICD-10-CM | POA: Diagnosis not present

## 2020-11-27 DIAGNOSIS — Z79899 Other long term (current) drug therapy: Secondary | ICD-10-CM | POA: Diagnosis not present

## 2020-12-05 DIAGNOSIS — I10 Essential (primary) hypertension: Secondary | ICD-10-CM | POA: Diagnosis not present

## 2020-12-05 DIAGNOSIS — R7303 Prediabetes: Secondary | ICD-10-CM | POA: Diagnosis not present

## 2020-12-05 DIAGNOSIS — Z1159 Encounter for screening for other viral diseases: Secondary | ICD-10-CM | POA: Diagnosis not present

## 2020-12-05 DIAGNOSIS — F17210 Nicotine dependence, cigarettes, uncomplicated: Secondary | ICD-10-CM | POA: Diagnosis not present

## 2020-12-05 DIAGNOSIS — E789 Disorder of lipoprotein metabolism, unspecified: Secondary | ICD-10-CM | POA: Diagnosis not present

## 2020-12-05 DIAGNOSIS — I2510 Atherosclerotic heart disease of native coronary artery without angina pectoris: Secondary | ICD-10-CM | POA: Diagnosis not present

## 2020-12-28 DIAGNOSIS — Z79899 Other long term (current) drug therapy: Secondary | ICD-10-CM | POA: Diagnosis not present

## 2020-12-28 DIAGNOSIS — M545 Low back pain, unspecified: Secondary | ICD-10-CM | POA: Diagnosis not present

## 2020-12-28 DIAGNOSIS — F17200 Nicotine dependence, unspecified, uncomplicated: Secondary | ICD-10-CM | POA: Diagnosis not present

## 2020-12-28 DIAGNOSIS — F17210 Nicotine dependence, cigarettes, uncomplicated: Secondary | ICD-10-CM | POA: Diagnosis not present

## 2021-01-01 DIAGNOSIS — Z79899 Other long term (current) drug therapy: Secondary | ICD-10-CM | POA: Diagnosis not present

## 2021-01-19 DIAGNOSIS — F17200 Nicotine dependence, unspecified, uncomplicated: Secondary | ICD-10-CM | POA: Diagnosis not present

## 2021-01-19 DIAGNOSIS — F17210 Nicotine dependence, cigarettes, uncomplicated: Secondary | ICD-10-CM | POA: Diagnosis not present

## 2021-01-19 DIAGNOSIS — F411 Generalized anxiety disorder: Secondary | ICD-10-CM | POA: Diagnosis not present

## 2021-01-19 DIAGNOSIS — F330 Major depressive disorder, recurrent, mild: Secondary | ICD-10-CM | POA: Diagnosis not present

## 2021-01-29 DIAGNOSIS — F17210 Nicotine dependence, cigarettes, uncomplicated: Secondary | ICD-10-CM | POA: Diagnosis not present

## 2021-01-29 DIAGNOSIS — Z79899 Other long term (current) drug therapy: Secondary | ICD-10-CM | POA: Diagnosis not present

## 2021-01-29 DIAGNOSIS — M545 Low back pain, unspecified: Secondary | ICD-10-CM | POA: Diagnosis not present

## 2021-02-12 DIAGNOSIS — I255 Ischemic cardiomyopathy: Secondary | ICD-10-CM | POA: Diagnosis not present

## 2021-02-26 ENCOUNTER — Ambulatory Visit: Payer: BC Managed Care – PPO | Admitting: Cardiovascular Disease

## 2021-03-01 DIAGNOSIS — Z79899 Other long term (current) drug therapy: Secondary | ICD-10-CM | POA: Diagnosis not present

## 2021-03-01 DIAGNOSIS — F17210 Nicotine dependence, cigarettes, uncomplicated: Secondary | ICD-10-CM | POA: Diagnosis not present

## 2021-03-01 DIAGNOSIS — M545 Low back pain, unspecified: Secondary | ICD-10-CM | POA: Diagnosis not present

## 2021-03-05 DIAGNOSIS — Z79899 Other long term (current) drug therapy: Secondary | ICD-10-CM | POA: Diagnosis not present

## 2021-03-14 DIAGNOSIS — E789 Disorder of lipoprotein metabolism, unspecified: Secondary | ICD-10-CM | POA: Diagnosis not present

## 2021-03-14 DIAGNOSIS — F17210 Nicotine dependence, cigarettes, uncomplicated: Secondary | ICD-10-CM | POA: Diagnosis not present

## 2021-03-14 DIAGNOSIS — I10 Essential (primary) hypertension: Secondary | ICD-10-CM | POA: Diagnosis not present

## 2021-03-14 DIAGNOSIS — R7303 Prediabetes: Secondary | ICD-10-CM | POA: Diagnosis not present

## 2021-03-14 DIAGNOSIS — Z1159 Encounter for screening for other viral diseases: Secondary | ICD-10-CM | POA: Diagnosis not present

## 2021-03-22 DIAGNOSIS — F17210 Nicotine dependence, cigarettes, uncomplicated: Secondary | ICD-10-CM | POA: Diagnosis not present

## 2021-03-22 DIAGNOSIS — Z79899 Other long term (current) drug therapy: Secondary | ICD-10-CM | POA: Diagnosis not present

## 2021-03-22 DIAGNOSIS — M545 Low back pain, unspecified: Secondary | ICD-10-CM | POA: Diagnosis not present

## 2021-03-26 DIAGNOSIS — Z79899 Other long term (current) drug therapy: Secondary | ICD-10-CM | POA: Diagnosis not present

## 2021-04-13 DIAGNOSIS — F330 Major depressive disorder, recurrent, mild: Secondary | ICD-10-CM | POA: Diagnosis not present

## 2021-04-13 DIAGNOSIS — F411 Generalized anxiety disorder: Secondary | ICD-10-CM | POA: Diagnosis not present

## 2021-04-13 DIAGNOSIS — F17200 Nicotine dependence, unspecified, uncomplicated: Secondary | ICD-10-CM | POA: Diagnosis not present

## 2021-04-23 DIAGNOSIS — R030 Elevated blood-pressure reading, without diagnosis of hypertension: Secondary | ICD-10-CM | POA: Diagnosis not present

## 2021-04-23 DIAGNOSIS — F17210 Nicotine dependence, cigarettes, uncomplicated: Secondary | ICD-10-CM | POA: Diagnosis not present

## 2021-04-23 DIAGNOSIS — M545 Low back pain, unspecified: Secondary | ICD-10-CM | POA: Diagnosis not present

## 2021-04-23 DIAGNOSIS — Z6829 Body mass index (BMI) 29.0-29.9, adult: Secondary | ICD-10-CM | POA: Diagnosis not present

## 2021-04-23 DIAGNOSIS — Z79899 Other long term (current) drug therapy: Secondary | ICD-10-CM | POA: Diagnosis not present

## 2021-04-27 DIAGNOSIS — Z79899 Other long term (current) drug therapy: Secondary | ICD-10-CM | POA: Diagnosis not present

## 2021-05-09 DIAGNOSIS — Z79899 Other long term (current) drug therapy: Secondary | ICD-10-CM | POA: Diagnosis not present

## 2021-05-09 DIAGNOSIS — E1159 Type 2 diabetes mellitus with other circulatory complications: Secondary | ICD-10-CM | POA: Diagnosis not present

## 2021-05-09 DIAGNOSIS — F17210 Nicotine dependence, cigarettes, uncomplicated: Secondary | ICD-10-CM | POA: Diagnosis not present

## 2021-05-09 DIAGNOSIS — M545 Low back pain, unspecified: Secondary | ICD-10-CM | POA: Diagnosis not present

## 2021-05-11 DIAGNOSIS — Z79899 Other long term (current) drug therapy: Secondary | ICD-10-CM | POA: Diagnosis not present

## 2021-07-04 ENCOUNTER — Emergency Department (HOSPITAL_COMMUNITY): Payer: BC Managed Care – PPO

## 2021-07-04 ENCOUNTER — Other Ambulatory Visit: Payer: Self-pay

## 2021-07-04 ENCOUNTER — Encounter (HOSPITAL_COMMUNITY): Payer: Self-pay | Admitting: Emergency Medicine

## 2021-07-04 ENCOUNTER — Emergency Department (HOSPITAL_COMMUNITY)
Admission: EM | Admit: 2021-07-04 | Discharge: 2021-07-04 | Disposition: A | Discharge status: Home / Self Care | Payer: BC Managed Care – PPO | Attending: Emergency Medicine | Admitting: Emergency Medicine

## 2021-07-04 DIAGNOSIS — F17210 Nicotine dependence, cigarettes, uncomplicated: Secondary | ICD-10-CM | POA: Insufficient documentation

## 2021-07-04 DIAGNOSIS — Z720 Tobacco use: Secondary | ICD-10-CM

## 2021-07-04 DIAGNOSIS — Z951 Presence of aortocoronary bypass graft: Secondary | ICD-10-CM | POA: Diagnosis not present

## 2021-07-04 DIAGNOSIS — Z79899 Other long term (current) drug therapy: Secondary | ICD-10-CM | POA: Diagnosis not present

## 2021-07-04 DIAGNOSIS — I209 Angina pectoris, unspecified: Secondary | ICD-10-CM

## 2021-07-04 DIAGNOSIS — Z91148 Patient's other noncompliance with medication regimen for other reason: Secondary | ICD-10-CM | POA: Insufficient documentation

## 2021-07-04 DIAGNOSIS — I10 Essential (primary) hypertension: Secondary | ICD-10-CM | POA: Diagnosis not present

## 2021-07-04 DIAGNOSIS — R0789 Other chest pain: Secondary | ICD-10-CM | POA: Diagnosis present

## 2021-07-04 DIAGNOSIS — R079 Chest pain, unspecified: Secondary | ICD-10-CM

## 2021-07-04 DIAGNOSIS — Z9114 Patient's other noncompliance with medication regimen: Secondary | ICD-10-CM | POA: Diagnosis not present

## 2021-07-04 LAB — COMPREHENSIVE METABOLIC PANEL WITH GFR
ALT: 27 U/L (ref 0–44)
AST: 23 U/L (ref 15–41)
Albumin: 3.9 g/dL (ref 3.5–5.0)
Alkaline Phosphatase: 66 U/L (ref 38–126)
Anion gap: 7 (ref 5–15)
BUN: 11 mg/dL (ref 6–20)
CO2: 26 mmol/L (ref 22–32)
Calcium: 9 mg/dL (ref 8.9–10.3)
Chloride: 105 mmol/L (ref 98–111)
Creatinine, Ser: 1.38 mg/dL — ABNORMAL HIGH (ref 0.61–1.24)
GFR, Estimated: 59 mL/min — ABNORMAL LOW
Glucose, Bld: 125 mg/dL — ABNORMAL HIGH (ref 70–99)
Potassium: 4.7 mmol/L (ref 3.5–5.1)
Sodium: 138 mmol/L (ref 135–145)
Total Bilirubin: 0.5 mg/dL (ref 0.3–1.2)
Total Protein: 6.7 g/dL (ref 6.5–8.1)

## 2021-07-04 LAB — CBC WITH DIFFERENTIAL/PLATELET
Abs Immature Granulocytes: 0.05 10*3/uL (ref 0.00–0.07)
Basophils Absolute: 0 10*3/uL (ref 0.0–0.1)
Basophils Relative: 0 %
Eosinophils Absolute: 0.2 10*3/uL (ref 0.0–0.5)
Eosinophils Relative: 2 %
HCT: 43.6 % (ref 39.0–52.0)
Hemoglobin: 14.5 g/dL (ref 13.0–17.0)
Immature Granulocytes: 1 %
Lymphocytes Relative: 14 %
Lymphs Abs: 1.5 10*3/uL (ref 0.7–4.0)
MCH: 31.9 pg (ref 26.0–34.0)
MCHC: 33.3 g/dL (ref 30.0–36.0)
MCV: 95.8 fL (ref 80.0–100.0)
Monocytes Absolute: 0.6 10*3/uL (ref 0.1–1.0)
Monocytes Relative: 6 %
Neutro Abs: 8.2 10*3/uL — ABNORMAL HIGH (ref 1.7–7.7)
Neutrophils Relative %: 77 %
Platelets: 237 10*3/uL (ref 150–400)
RBC: 4.55 MIL/uL (ref 4.22–5.81)
RDW: 12 % (ref 11.5–15.5)
WBC: 10.6 10*3/uL — ABNORMAL HIGH (ref 4.0–10.5)
nRBC: 0 % (ref 0.0–0.2)

## 2021-07-04 LAB — TROPONIN I (HIGH SENSITIVITY)
Troponin I (High Sensitivity): 17 ng/L
Troponin I (High Sensitivity): 13 ng/L

## 2021-07-04 LAB — D-DIMER, QUANTITATIVE: D-Dimer, Quant: 0.38 ug/mL-FEU (ref 0.00–0.50)

## 2021-07-04 MED ORDER — ASPIRIN 81 MG PO CHEW
324.0000 mg | CHEWABLE_TABLET | Freq: Once | ORAL | Status: AC
  Administered 2021-07-04: 324 mg via ORAL
  Filled 2021-07-04: qty 4

## 2021-07-04 MED ORDER — NICOTINE 14 MG/24HR TD PT24
14.0000 mg | MEDICATED_PATCH | Freq: Once | TRANSDERMAL | Status: DC
  Administered 2021-07-04: 14 mg via TRANSDERMAL
  Filled 2021-07-04: qty 1

## 2021-07-04 MED ORDER — ASPIRIN EC 81 MG PO TBEC
81.0000 mg | DELAYED_RELEASE_TABLET | Freq: Every day | ORAL | Status: DC
  Administered 2021-07-04: 81 mg via ORAL
  Filled 2021-07-04: qty 1

## 2021-07-04 MED ORDER — ATORVASTATIN CALCIUM 40 MG PO TABS
80.0000 mg | ORAL_TABLET | Freq: Every day | ORAL | Status: DC
  Administered 2021-07-04: 80 mg via ORAL
  Filled 2021-07-04: qty 2

## 2021-07-04 MED ORDER — METOPROLOL SUCCINATE ER 25 MG PO TB24: 25.0000 mg | ORAL_TABLET | Freq: Every day | ORAL | Status: DC

## 2021-07-04 MED ORDER — METOPROLOL SUCCINATE ER 25 MG PO TB24
50.0000 mg | ORAL_TABLET | Freq: Every day | ORAL | Status: DC
  Administered 2021-07-04: 50 mg via ORAL
  Filled 2021-07-04: qty 2

## 2021-07-04 MED ORDER — SODIUM CHLORIDE 0.9 % IV BOLUS: 1000.0000 mL | Freq: Once | INTRAVENOUS | Status: DC

## 2021-07-04 NOTE — H&P (Deleted)
See consult note

## 2021-07-04 NOTE — ED Notes (Signed)
Patient verbalizes understanding of discharge instructions. Opportunity for questioning and answers were provided. Armband removed by staff, pt discharged from ED. Pt refused d/c vitals.  

## 2021-07-04 NOTE — ED Provider Notes (Signed)
[facility] EMERGENCY DEPARTMENT Provider Note   CSN: [id number] Arrival date & time: 07/04/21  [id number]     History  No chief complaint on file.   Adam Kerr is a 61 y.o. male.  This is a 61 y.o. male with significant medical history as below, including extensive CAD, hyperlipidemia, medication noncompliance, ischemic cardiomyopathy, status post PCI to LAD who presents to the ED with complaint of chest pressure and jaw pain.  When asked the patient what brings him to the hospital today his wife responded with "did you not you read his fucking chart."   Location: Mid sternum, left-sided chest wall with radiation to the jaw Duration: Approximately 12 hours Onset: Sudden Timing:  constant Description: Pressure, heaviness; midsternal to left-sided jaw Severity:  mild Exacerbating/Alleviating Factors: Minimally worsened with exertion, improved with nitroglycerin x2 Associated Symptoms: Mild nausea, mild lightheadedness. Pertinent Negatives: No fevers, chills, abdominal pain, vomiting, change in bowel or bladder function, no illicit drug use, no recent stimulant use.  No cocaine.  Context: Patient feels pain is similar to when he had a heart attack last year.  He also stopped taking all of his medications last year because it "made me feel bad."  Patient thinks that perhaps he ate too much chicken pot pie last night which provoked his chest pain.    Past Medical History: 02/26/2020: At risk for sudden cardiac death, lifevest placed No date: CAD S/P percutaneous coronary angioplasty 02/26/2020: Cardiomyopathy ([place]) (ischemic)  No date: Depression No date: HLD (hyperlipidemia) No date: MI (myocardial infarction) ([place]) No date: Osteoarthritis 02/26/2020: Pre-diabetes 02/26/2020: S/P angioplasty with stent, DES to ostium of LAD 02/23/20 2013, 2021: S/P drug eluting coronary stent placement 02/26/2020: Tobacco abuse No date: Tobacco dependence due to  cigarettes  Past Surgical History: 02/23/2020: CORONARY/GRAFT ACUTE MI REVASCULARIZATION; N/A     Comment:  Procedure: Coronary/Graft Acute MI Revascularization;                Surgeon: Sherren Mocha, MD;  Location: [place] [facility]               LAB;  Service: Cardiovascular;  Laterality: N/A; 12/16/2017: INTRAVASCULAR PRESSURE WIRE/FFR STUDY; N/A     Comment:  Procedure: INTRAVASCULAR PRESSURE WIRE/FFR STUDY;                Surgeon: Martinique, Peter M, MD;  Location: [place] [facility]               LAB;  Service: Cardiovascular;  Laterality: N/A; 12/16/2017: LEFT HEART CATH AND CORONARY ANGIOGRAPHY; N/A     Comment:  Procedure: LEFT HEART CATH AND CORONARY ANGIOGRAPHY;                Surgeon: Martinique, Peter M, MD;  Location: [place] [facility]               LAB;  Service: Cardiovascular;  Laterality: N/A; 02/23/2020: LEFT HEART CATH AND CORONARY ANGIOGRAPHY 02/23/2020: LEFT HEART CATH AND CORONARY ANGIOGRAPHY; N/A     Comment:  Procedure: LEFT HEART CATH AND CORONARY ANGIOGRAPHY;                Surgeon: Sherren Mocha, MD;  Location: [place] [facility]               LAB;  Service: Cardiovascular;  Laterality: N/A;    The history is provided by the patient and a relative. No language interpreter was used.   HPI: A 61 year old patient with a history of  hypertension and hypercholesterolemia presents for evaluation of chest pain. Initial onset of pain was more than 6 hours ago. The patient's chest pain is described as heaviness/pressure/tightness, is not worse with exertion and is relieved by nitroglycerin. The patient's chest pain is middle- or left-sided, is not well-localized, is not sharp and does radiate to the arms/jaw/neck. The patient does not complain of nausea and denies diaphoresis. The patient has smoked in the past 90 days. The patient has no history of stroke, has no history of peripheral artery disease, denies any history of treated diabetes, has no relevant family history of coronary artery  disease (first degree relative at less than age 94) and does not have an elevated BMI (>=30).   Home Medications Prior to Admission medications   Medication Sig Start Date End Date Taking? Authorizing Provider  DULoxetine (CYMBALTA) 60 MG capsule Take 60 mg by mouth 2 (two) times daily. 09/09/17  Yes [provider]  oxyCODONE-acetaminophen (PERCOCET) 10-325 MG tablet Take 1-2 tablets by mouth every 6 (six) hours as needed for pain.   Yes [provider]  atorvastatin (LIPITOR) 40 MG tablet Take 40 mg by mouth daily. Patient not taking: Reported on 07/04/2021 03/14/21   [provider]  atorvastatin (LIPITOR) 80 MG tablet Take 1 tablet (80 mg total) by mouth daily. Patient not taking: Reported on 07/04/2021 02/26/20   Adam Serge, NP  carvedilol (COREG) 6.25 MG tablet Take 1 tablet (6.25 mg total) by mouth 2 (two) times daily with a meal. Patient not taking: Reported on 07/04/2021 02/26/20   Adam Serge, NP  dapagliflozin propanediol (FARXIGA) 10 MG TABS tablet Take 1 tablet (10 mg total) by mouth daily. Patient not taking: Reported on 07/04/2021 02/26/20   Adam Serge, NP  nitroGLYCERIN (NITROSTAT) 0.4 MG SL tablet Place 1 tablet (0.4 mg total) under the tongue every 5 (five) minutes x 3 doses as needed for chest pain. Patient not taking: Reported on 07/04/2021 02/26/20   Adam Serge, NP  ticagrelor (BRILINTA) 90 MG TABS tablet Take 1 tablet (90 mg total) by mouth 2 (two) times daily. Patient not taking: Reported on 07/04/2021 02/25/20   Cheryln Manly, NP      Allergies    Patient has no known allergies.    Review of Systems   Review of Systems  Constitutional:  Negative for chills and fever.  HENT:  Negative for facial swelling and trouble swallowing.   Eyes:  Negative for photophobia and visual disturbance.  Respiratory:  Positive for chest tightness. Negative for cough and shortness of breath.   Cardiovascular:  Positive for chest pain. Negative for  palpitations.  Gastrointestinal:  Positive for nausea. Negative for abdominal pain and vomiting.  Endocrine: Negative for polydipsia and polyuria.  Genitourinary:  Negative for difficulty urinating and hematuria.  Musculoskeletal:  Negative for gait problem and joint swelling.  Skin:  Negative for pallor and rash.  Neurological:  Positive for light-headedness. Negative for syncope and headaches.  Psychiatric/Behavioral:  Negative for agitation and confusion.    Physical Exam Updated Vital Signs BP (!) 161/92    Pulse 60    Temp 98.6 F (37 C) (Oral)    Resp 14    SpO2 96%  Physical Exam Vitals and nursing note reviewed.  Constitutional:      General: He is not in acute distress.    Appearance: Normal appearance. He is well-developed. He is not ill-appearing, toxic-appearing or diaphoretic.  HENT:     Head:  Normocephalic and atraumatic.     Right Ear: External ear normal.     Left Ear: External ear normal.     Mouth/Throat:     Mouth: Mucous membranes are moist.  Eyes:     General: No scleral icterus. Cardiovascular:     Rate and Rhythm: Normal rate and regular rhythm.     Pulses: Normal pulses.     Heart sounds: Normal heart sounds.  Pulmonary:     Effort: Pulmonary effort is normal. No respiratory distress.     Breath sounds: Normal breath sounds.  Abdominal:     General: Abdomen is flat.     Palpations: Abdomen is soft.     Tenderness: There is no abdominal tenderness.  Musculoskeletal:        General: Normal range of motion.     Cervical back: Normal range of motion.     Right lower leg: No edema.     Left lower leg: No edema.  Skin:    General: Skin is warm and dry.     Capillary Refill: Capillary refill takes less than 2 seconds.  Neurological:     Mental Status: He is alert and oriented to person, place, and time.  Psychiatric:        Mood and Affect: Mood normal.        Behavior: Behavior normal.    ED Results / Procedures / Treatments   Labs (all labs  ordered are listed, but only abnormal results are displayed) Labs Reviewed  COMPREHENSIVE METABOLIC PANEL - Abnormal; Notable for the following components:      Result Value   Glucose, Bld 125 (*)    Creatinine, Ser 1.38 (*)    GFR, Estimated 59 (*)    All other components within normal limits  CBC WITH DIFFERENTIAL/PLATELET - Abnormal; Notable for the following components:   WBC 10.6 (*)    Neutro Abs 8.2 (*)    All other components within normal limits  RESP PANEL BY RT-PCR (FLU A&B, COVID) [id number]  D-DIMER, QUANTITATIVE  TROPONIN I (HIGH SENSITIVITY)  TROPONIN I (HIGH SENSITIVITY)    EKG EKG Interpretation  Date/Time:  Wednesday July 04 2021 05:33:17 EST Ventricular Rate:  87 PR Interval:  122 QRS Duration: 78 QT Interval:  362 QTC Calculation: 435 R Axis:   14 Text Interpretation: Sinus rhythm with marked sinus arrhythmia Septal infarct , age undetermined Abnormal ECG When compared with ECG of 25-Feb-2020 06:18, PREVIOUS ECG IS PRESENT similar to prior tracing improved anterolateral t wave changes from prior no stemi Confirmed by Wynona Dove ([id number]) on 07/04/2021 9:06:00 AM  Radiology DG Chest 2 View  Result Date: 07/04/2021 CLINICAL DATA:  61 year old male with chest and jaw pain.  Smoker. EXAM: CHEST - 2 VIEW COMPARISON:  Portable chest 02/23/2020 and earlier. FINDINGS: Mildly lower lung volumes compared to 2019. Normal cardiac size and mediastinal contours. Visualized tracheal air column is within normal limits. Mild diffuse increased interstitial markings not significantly changed from 2019 and likely smoking related. No pneumothorax, pulmonary edema, pleural effusion or confluent pulmonary opacity. No acute osseous abnormality identified. Negative visible bowel gas pattern. IMPRESSION: No acute cardiopulmonary abnormality. Electronically Signed   By: Genevie Ann M.D.   On: 07/04/2021 06:34    Procedures Procedures    Medications Ordered in ED Medications  sodium chloride  0.9 % bolus 1,000 mL (1,000 mLs Intravenous Patient Refused/Not Given 07/04/21 1415)  nicotine (NICODERM CQ - dosed in mg/24 hours) patch 14 mg (14 mg Transdermal  Patch Applied 07/04/21 1304)  aspirin EC tablet 81 mg (81 mg Oral Given 07/04/21 1410)  atorvastatin (LIPITOR) tablet 80 mg (80 mg Oral Given 07/04/21 1409)  metoprolol succinate (TOPROL-XL) 24 hr tablet 50 mg (50 mg Oral Given 07/04/21 1410)  aspirin chewable tablet 324 mg (324 mg Oral Given 07/04/21 1138)    ED Course/ Medical Decision Making/ A&P Clinical Course as of 07/04/21 1703  Wed Jul 04, 2021  1247 Pt refusing IV, refusing to stay on telemetry.  [SG]    Clinical Course User Index [SG] Jeanell Sparrow, DO   HEAR Score: 5                       Medical Decision Making Amount and/or Complexity of Data Reviewed Labs: ordered.  Risk OTC drugs.    CC: Chest pain  This patient presents to the Emergency Department for the above complaint. This involves an extensive number of treatment options and is a complaint that carries with it a high risk of complications and morbidity. Vital signs were reviewed. Serious etiologies considered.  Record review:  Previous records obtained and reviewed   Additional history obtained from spouse  Medical and surgical history as noted above.     Last Echocardiogram 1/22 EF 40 to 45%   Work up as above, notable for:  Labs & imaging results that were available during my care of the patient were reviewed by me and considered in my medical decision making.   I ordered imaging studies which included chest x-ray and I independently visualized and interpreted imaging which showed no acute process  Cardiac monitoring reviewed and interpreted personally which shows sinus bradycardia  Social determinants of health include - N/a  Personally discussed patient care with cardiology; patient can follow-up as an outpatient, recommends restarting his home medications were sent by cardiologist.  > o/p  cardiologist is Dr Ezzie Dural   At this time there is concern for unstable angina given patient with pain unprovoked by exertion.  Improved with nitroglycerin.  Patient with MI last year, stopped taking all his medications.  Still smoking cigarettes..  Management: Aspirin given upon arrival to treatment area  Reassessment:  Patient still has ongoing chest pain, patient is noncompliant with his cardiac medications.  Patient resumed his cardiac medications still having chest pain would recommend further cardiology evaluation per d/w cardiology.    The patient improved significantly and was discharged in stable condition. Detailed discussions were had with the patient regarding current findings, and need for close f/u with PCP or on call doctor. The patient has been instructed to return immediately if the symptoms worsen in any way for re-evaluation. Patient verbalized understanding and is in agreement with current care plan. All questions answered prior to discharge.      Counseled patient for approximately 4 minutes regarding smoking cessation. Discussed risks of smoking and how they applied and affected their visit here today. Patient not ready to quit at this time, however will follow up with their primary doctor when they are.   CPT code: [id number]: intermediate counseling for smoking cessation     This chart was dictated using voice recognition software.  Despite best efforts to proofread,  errors can occur which can change the documentation meaning.         Final Clinical Impression(s) / ED Diagnoses Final diagnoses:  Noncompliance with medication regimen  Tobacco abuse  Cardiac angina (HCC)  Chest pain, unspecified type    Rx /  DC Orders ED Discharge Orders     None         Jeanell Sparrow, DO 07/04/21 1703

## 2021-07-04 NOTE — Discharge Instructions (Addendum)
It was a pleasure caring for you today in the emergency department.  Please resume your home medications as directed by cardiology.   Please return to the emergency department for any worsening or worrisome symptoms.

## 2021-07-04 NOTE — ED Notes (Addendum)
Pt requesting to not have IV at this time. Pt states, " I am going home soon." Pt also refusing blood work at this time. MD Pearline Cables made aware.

## 2021-07-04 NOTE — ED Notes (Signed)
Pt refusing d/c vitals.

## 2021-07-04 NOTE — Consult Note (Signed)
Cardiology Consult:   Patient ID: Adam Kerr MRN: [id number]; DOB: 1960/12/06   Admission date: 07/04/2021  PCP:  Patient, No Pcp Per (Inactive)   [facility] Providers Cardiologist:  Sherren Mocha, MD      Chief Complaint:  Chest pain   Patient Profile:   Adam Kerr is a 61 y.o. male with CAD s/p NSTEMI in 2013 with PCI to LAD and anterior STEMI 9/21 with PCI to oLAD, HFrEF (ischemic cardiomyopathy), HLD, Tobacco abuse, diabetes, medication noncompliance who is being seen 07/04/2021 for the evaluation of chest pain at the request of Dr. Pearline Cables.  History of Present Illness:   [name] is a 61 year old male with above past medical history.  Patient is typically followed by Dr. Burt Knack.  In 2013, patient had an NSTEMI and underwent PCI to the LAD.  Patient later had a STEMI on 02/23/2020 that resulted in PCI to the ost LAD. Echo following the STEMI on 02/24/2020 showed an LVEF 25-30%, severely decreased LV function, regional wall motion abnormalities, and grade I diastolic dysfunction. Echo in 06/06/2020 showed that the EF had improved to 40-45% and LV function had improved, but there was still gloval hypokinesis and grade I diastolic dysfunction. Patient was last seen by cardiology on 06/09/20. At that appointment, patient was feeling well and had no specific complaints. Failed to present to follow up appointments.   Patient presented to the ED on 22/01 complaining of chest pain that radiates to the jaw. Pain started last night. Labs in the ED showed Na 138, K 4.7, creatinine 1.38, eGFR 59, hemoglobin 14.5, WBC 10.6. HSTN 17>>13. CXR showed no acute cardiopulmonary abnormality. EKG showed sinus rhythm with marked sinus arrythmia, septal infarct (present since 03/2020).   On interview, patient reports that he had chest pressure that started last night while he was asleep.  Patient woke up to use the restroom and felt pressure in the middle of his chest under his sternum.  Sensation  continued throughout the night and is described as mild.  Was not associated with shortness of breath, dizziness, palpitations. pressure improved with nitroglycerin x2.  Denies being in any chest pain at this time.  Denies recent episodes of chest pain.  Denies recent shortness of breath on exertion.  Continues to use cigarettes, about 1 pack/day.  Has not been taking his cardiac medicines because "they make him feel bad".  Reports he has a cardiologist at [place] that he last saw in September.   Past Medical History:  Diagnosis Date   At risk for sudden cardiac death, lifevest placed 03-18-2020   CAD S/P percutaneous coronary angioplasty    Cardiomyopathy [facility]) (ischemic)  03-18-20   Depression    HLD (hyperlipidemia)    MI (myocardial infarction) ([place])    Osteoarthritis    Pre-diabetes 18-Mar-2020   S/P angioplasty with stent, DES to ostium of LAD 02/23/20 March 18, 2020   S/P drug eluting coronary stent placement 2013, 2021   Tobacco abuse 03/18/2020   Tobacco dependence due to cigarettes     Past Surgical History:  Procedure Laterality Date   CORONARY/GRAFT ACUTE MI REVASCULARIZATION N/A 02/23/2020   Procedure: Coronary/Graft Acute MI Revascularization;  Surgeon: Sherren Mocha, MD;  Location: [facility];  Service: Cardiovascular;  Laterality: N/A;   INTRAVASCULAR PRESSURE WIRE/FFR STUDY N/A 12/16/2017   Procedure: INTRAVASCULAR PRESSURE WIRE/FFR STUDY;  Surgeon: Martinique, Peter M, MD;  Location: [facility] [facility];  Service: Cardiovascular;  Laterality: N/A;   LEFT HEART CATH AND CORONARY  ANGIOGRAPHY N/A 12/16/2017   Procedure: LEFT HEART CATH AND CORONARY ANGIOGRAPHY;  Surgeon: Martinique, Peter M, MD;  Location: [facility];  Service: Cardiovascular;  Laterality: N/A;   LEFT HEART CATH AND CORONARY ANGIOGRAPHY  02/23/2020   LEFT HEART CATH AND CORONARY ANGIOGRAPHY N/A 02/23/2020   Procedure: LEFT HEART CATH AND CORONARY ANGIOGRAPHY;  Surgeon: Sherren Mocha, MD;  Location: [place];  Service: Cardiovascular;  Laterality: N/A;     Medications Prior to Admission: Prior to Admission medications   Medication Sig Start Date End Date Taking? Authorizing Provider  acetaminophen (TYLENOL) 325 MG tablet Take 2 tablets (650 mg total) by mouth every 4 (four) hours as needed for headache or mild pain. 12/17/17   Geradine Girt, DO  aspirin EC 81 MG tablet Take 81 mg by mouth daily. 10/14/17   [provider]  atorvastatin (LIPITOR) 80 MG tablet Take 1 tablet (80 mg total) by mouth daily. 02/26/20   [name], NP  carvedilol (COREG) 6.25 MG tablet Take 1 tablet (6.25 mg total) by mouth 2 (two) times daily with a meal. 02/26/20   [name], NP  dapagliflozin propanediol (FARXIGA) 10 MG TABS tablet Take 1 tablet (10 mg total) by mouth daily. 02/26/20   [name], NP  DULoxetine (CYMBALTA) 60 MG capsule Take 60 mg by mouth 2 (two) times daily. 09/09/17   [provider]  HYDROcodone Bitartrate ER 40 MG T24A  05/31/20   [provider]  HYDROcodone-acetaminophen (NORCO) 7.5-325 MG tablet Take 1 tablet by mouth 4 (four) times daily as needed for pain. 02/02/20   [provider]  losartan (COZAAR) 25 MG tablet Take 25 mg by mouth every other day.    [provider]  nitroGLYCERIN (NITROSTAT) 0.4 MG SL tablet Place 1 tablet (0.4 mg total) under the tongue every 5 (five) minutes x 3 doses as needed for chest pain. 02/26/20   [name], NP  pantoprazole (PROTONIX) 40 MG tablet Take 1 tablet (40 mg total) by mouth daily. 02/26/20   [name], NP  QUEtiapine (SEROQUEL) 100 MG tablet Take 50 mg by mouth at bedtime.    [provider]  ticagrelor (BRILINTA) 90 MG TABS tablet Take 1 tablet (90 mg total) by mouth 2 (two) times daily. 02/25/20   Cheryln Manly, NP     Allergies:   No Known Allergies  Social History:   Social History   Socioeconomic History   Marital status: Single    Spouse name: Not on  file   Number of children: Not on file   Years of education: Not on file   Highest education level: Not on file  Occupational History   Not on file  Tobacco Use   Smoking status: Every Day    Packs/day: 1.00    Years: 38.00    Pack years: 38.00    Types: Cigarettes   Smokeless tobacco: Never   Tobacco comments:    Pt chews 10 pieces of Nicotine gum  Vaping Use   Vaping Use: Never used  Substance and Sexual Activity   Alcohol use: Not Currently   Drug use: Never   Sexual activity: Yes    Partners: Female  Other Topics Concern   Not on file  Social History Narrative   Not on file   Social Determinants of Health   Financial Resource Strain: Not on file  Food Insecurity: Not on file  Transportation Needs: Not on  file  Physical Activity: Not on file  Stress: Not on file  Social Connections: Not on file  Intimate Partner Violence: Not on file    Family History:   The patient's family history is not on file.    ROS:  Please see the history of present illness.  All other ROS reviewed and negative.     Physical Exam/Data:   Vitals:   07/04/21 1002 07/04/21 1145 07/04/21 1245 07/04/21 1345  BP: (!) 144/92 125/90 134/89 (!) 161/92  Pulse: (!) 55 (!) 57 72 60  Resp: $Remo'20 16 18 14  'tXmfG$ Temp:      TempSrc:      SpO2: 98% 97% 99% 96%   No intake or output data in the 24 hours ending 07/04/21 1349 Last 3 Weights 06/09/2020 03/31/2020 03/08/2020  Weight (lbs) 195 lb 12.8 oz 199 lb 205 lb  Weight (kg) 88.814 kg 90.266 kg 92.987 kg     There is no height or weight on file to calculate BMI.  General:  Well nourished, well developed, in no acute distress HEENT: normal Neck: no JVD Vascular: Radial pulses 2+ bilaterally   Cardiac:  normal S1, S2; RRR; no murmur  Lungs:  clear to auscultation bilaterally, no wheezing, rhonchi or rales  Abd: soft, nontender, no hepatomegaly  Ext: no edema Musculoskeletal:  No deformities, BUE and BLE strength normal and equal Skin: warm and dry   Neuro:  CNs 2-12 intact, no focal abnormalities noted Psych:  Normal affect    EKG:  The ECG that was done 2/1 was personally reviewed and demonstrates sinus rhythm with marked sinus arrhythmia, EKG is overall unchanged when compared to EKG from 03/2020   Relevant CV Studies:  Cath 02/23/2020  1.  Anterior STEMI secondary to subtotal occlusion at the ostium of the LAD, treated successfully with primary PCI using a 3.5 x 12 mm resolute Onyx DES postdilated with a 4 mm balloon and evaluated with intravascular ultrasound following deployment 2.  Moderately severe stenosis at the left circumflex/first obtuse marginal bifurcation and a left dominant circumflex with an area of ulceration just proximal to the lesion 3.  Patent codominant right coronary artery without significant stenosis 4.  Mildly elevated LVEDP   Recommendations: Aggressive medical therapy, dual antiplatelet therapy with aspirin and ticagrelor probably long-term but a minimum of 12 months without interruption, consider staged PCI of the left circumflex bifurcation, will review with colleagues and discuss appropriate timing. Diagnostic Dominance: Left Intervention    Echo 06/07/2020  1. Left ventricular ejection fraction, by estimation, is 40 to 45%. The  left ventricle has mildly decreased function. The left ventricle  demonstrates global hypokinesis. Left ventricular diastolic parameters are  consistent with Grade I diastolic  dysfunction (impaired relaxation).   2. Right ventricular systolic function is normal. The right ventricular  size is normal.   3. The mitral valve is normal in structure. No evidence of mitral valve  regurgitation. No evidence of mitral stenosis.   4. The aortic valve is normal in structure. Aortic valve regurgitation is  trivial. No aortic stenosis is present.   5. The inferior vena cava is normal in size with greater than 50%  respiratory variability, suggesting right atrial pressure of 3 mmHg.    Comparison(s): Prior images reviewed side by side. The left ventricular  function has improved.     Laboratory Data:  High Sensitivity Troponin:   Recent Labs  Lab 07/04/21 0544 07/04/21 0846  TROPONINIHS 17 13  Chemistry Recent Labs  Lab 07/04/21 0544  NA 138  K 4.7  CL 105  CO2 26  GLUCOSE 125*  BUN 11  CREATININE 1.38*  CALCIUM 9.0  GFRNONAA 59*  ANIONGAP 7    Recent Labs  Lab 07/04/21 0544  PROT 6.7  ALBUMIN 3.9  AST 23  ALT 27  ALKPHOS 66  BILITOT 0.5   Lipids No results for input(s): CHOL, TRIG, HDL, LABVLDL, LDLCALC, CHOLHDL in the last 168 hours. Hematology Recent Labs  Lab 07/04/21 0544  WBC 10.6*  RBC 4.55  HGB 14.5  HCT 43.6  MCV 95.8  MCH 31.9  MCHC 33.3  RDW 12.0  PLT 237   Thyroid No results for input(s): TSH, FREET4 in the last 168 hours. BNPNo results for input(s): BNP, PROBNP in the last 168 hours.  DDimer No results for input(s): DDIMER in the last 168 hours.   Radiology/Studies:  DG Chest 2 View  Result Date: 07/04/2021 CLINICAL DATA:  61 year old male with chest and jaw pain.  Smoker. EXAM: CHEST - 2 VIEW COMPARISON:  Portable chest 02/23/2020 and earlier. FINDINGS: Mildly lower lung volumes compared to 2019. Normal cardiac size and mediastinal contours. Visualized tracheal air column is within normal limits. Mild diffuse increased interstitial markings not significantly changed from 2019 and likely smoking related. No pneumothorax, pulmonary edema, pleural effusion or confluent pulmonary opacity. No acute osseous abnormality identified. Negative visible bowel gas pattern. IMPRESSION: No acute cardiopulmonary abnormality. Electronically Signed   By: Genevie Ann M.D.   On: 07/04/2021 06:34     Assessment and Plan:   Chest pain: Described as pressure, located in the middle of chest under sternum, developed while patient was sleeping. No recent episodes of chest discomfort. No associated symptoms  - HSTN 17>>13  - CXR without  acute cardiopulmonary abnormality  - Patient is not tachycardic, tachypneic.  Blood pressure is elevated to 144/92  - EKG showed sinus rhythm with marked sinus arrhythmia, no significant changes from EKG on 03/08/2020  - Patient reports having a cardiologist at [facility] -Denies taking any of his previously prescribed heart medications because they "made him feel bad" -Discussed the importance of aspirin, statin, beta-blocker.  Patient is hesitant to start medications. Seemed agreeable to ASA, statin. We will start patient on Lipitor 80, daily aspirin, Toprol $RemoveBeforeD'50mg'[id number]$   - Recommended outpatient follow up with primary cardiologist   Tobacco Use  - Educated patient on the importance of tobacco cessation   Combined Congestive Heart Failure - Last echo was on 06/07/20 and showed EF 40 to 20%, grade 1 diastolic dysfunction  - Patient euvolemic on exam  - Denies any recent SOB, ankle swelling, dyspnea while laying flat  - Can be followed up outpatient    Risk Assessment/Risk Scores:   HEAR Score (for undifferentiated chest pain):  HEAR Score: 5{   For questions or updates, please contact [place] Please consult [email or web address] for contact info under     Signed, Margie Billet, PA-C  07/04/2021 1:49 PM

## 2021-07-04 NOTE — ED Triage Notes (Signed)
Pt here from home with c/o chest pain radiating to the jaw that started last night , pt has hx of 2 stents , does not take some of his prescription meds and still smokes

## 2021-07-04 NOTE — ED Provider Triage Note (Signed)
Emergency Medicine Provider Triage Evaluation Note  Adam Kerr , a 61 y.o. male  was evaluated in triage.  Pt complains of chest pressure.  Patient states that he was experiencing mild central chest pressure that was radiating to his jaw around 12:30 AM.  He states he went to bed and woke up with similar symptoms.  He took 2 doses of nitroglycerin with mild relief of his symptoms but still notes some mild chest pressure.  Denies any shortness of breath, nausea, vomiting, or diaphoresis.  Reports a history of MI x2 and is status post stenting.  He states that he has not taken any of his regular medications for some time due to "it making me feel bad".  He smokes 1 pack/day.  Physical Exam  BP (!) 141/100 (BP Location: Left Arm)    Pulse 70    Temp 99.6 F (37.6 C) (Oral)    Resp 16    SpO2 94%  Gen:   Awake, no distress   Resp:  Normal effort  MSK:   Moves extremities without difficulty  Other:    Medical Decision Making  Medically screening exam initiated at 5:40 AM.  Appropriate orders placed.  Nelvin Tomb was informed that the remainder of the evaluation will be completed by another provider, this initial triage assessment does not replace that evaluation, and the importance of remaining in the ED until their evaluation is complete.   Placido Sou, PA-C 07/04/21 [phone]

## 2022-06-25 IMAGING — DX DG CHEST 1V PORT
1 series · 1 of 1 positions shown · non-contrast
Comparison: 12/15/17

CLINICAL DATA: STEMI

EXAM:
PORTABLE CHEST 1 VIEW

[chest ap]
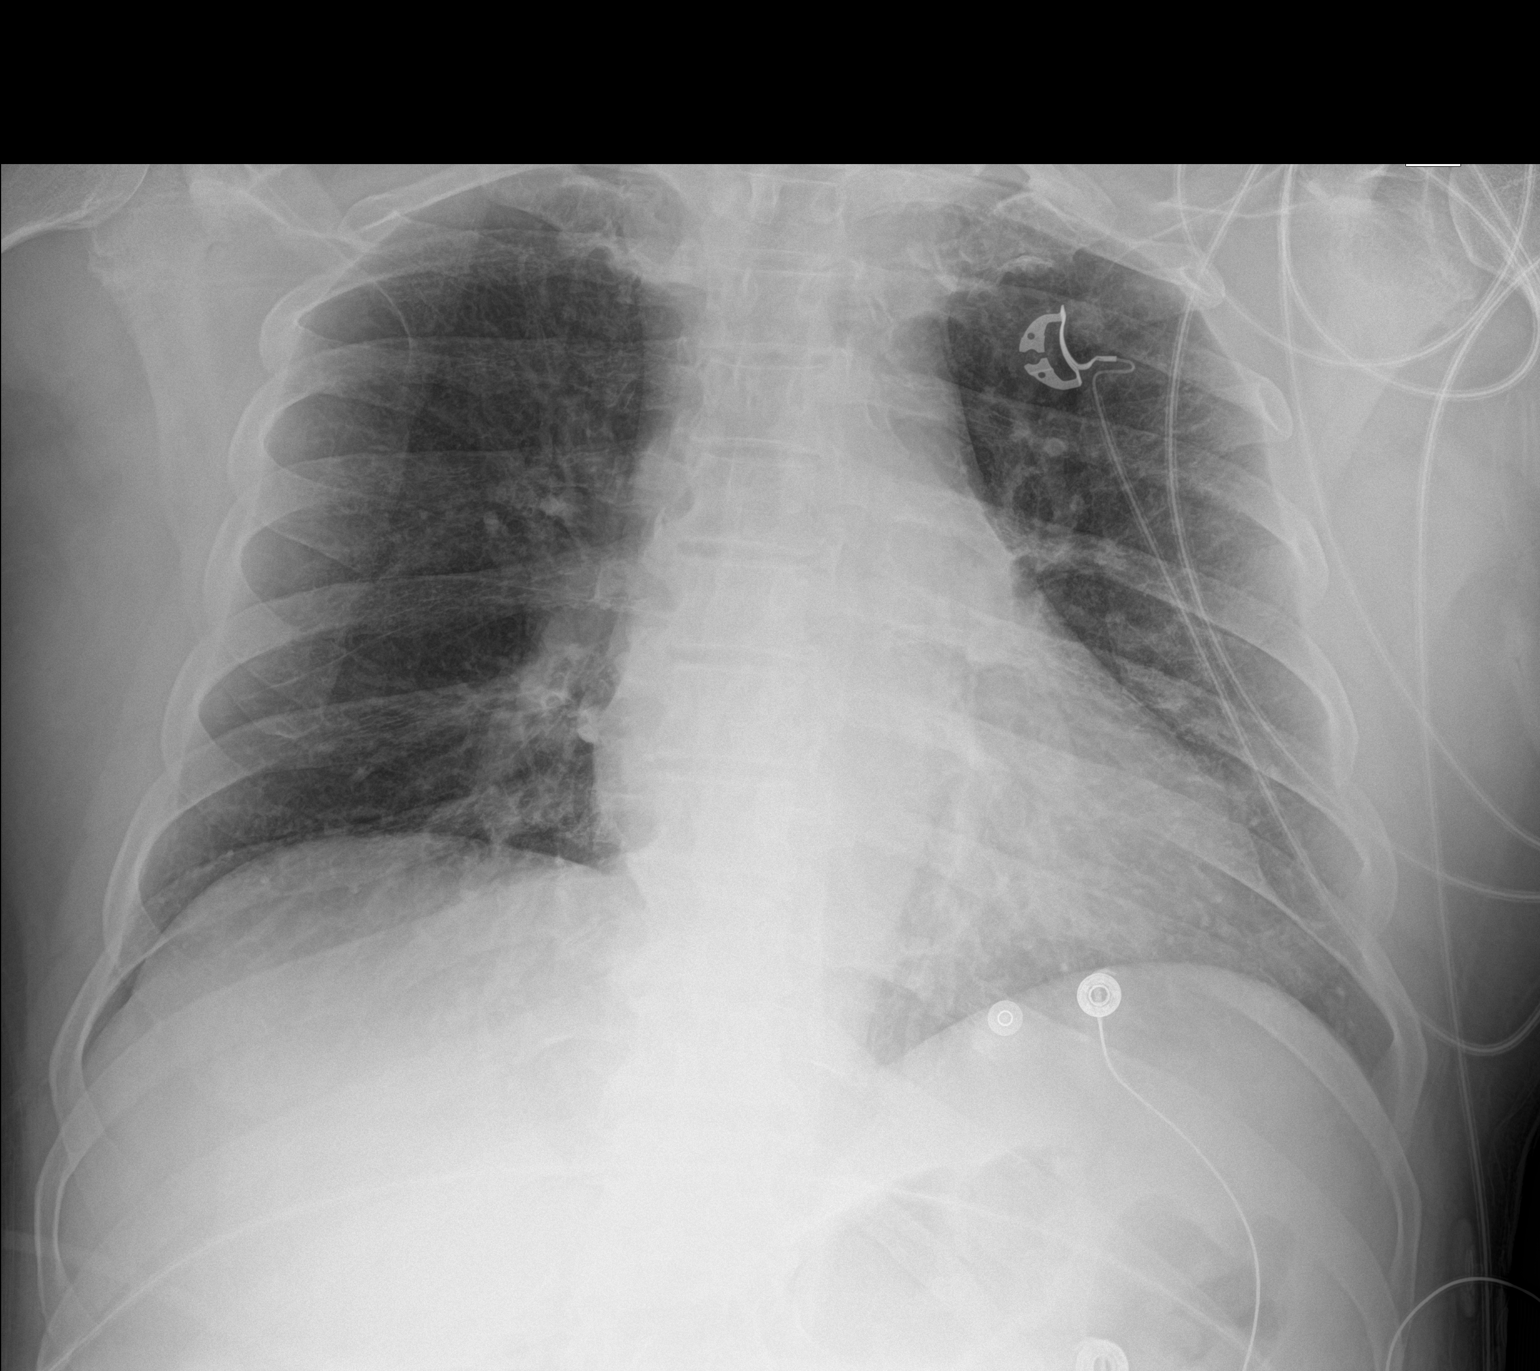

[1 of 1 positions shown; findings below may reference images not displayed]

FINDINGS: The heart size and mediastinal contours are within normal limits.
Both lungs are clear. The visualized skeletal structures are
unremarkable.
IMPRESSION: No active disease.
